# Patient Record
Sex: Male | Born: 1975
Health system: Southern US, Community
[De-identification: ages and names within clinical notes are randomized; demographics above are authoritative.]

## PROBLEM LIST (undated history)

## (undated) DIAGNOSIS — S71131A Puncture wound without foreign body, right thigh, initial encounter: Secondary | ICD-10-CM

## (undated) DIAGNOSIS — E7800 Pure hypercholesterolemia, unspecified: Secondary | ICD-10-CM

## (undated) DIAGNOSIS — F1910 Other psychoactive substance abuse, uncomplicated: Secondary | ICD-10-CM

## (undated) DIAGNOSIS — I10 Essential (primary) hypertension: Secondary | ICD-10-CM

## (undated) DIAGNOSIS — R51 Headache: Secondary | ICD-10-CM

## (undated) DIAGNOSIS — F419 Anxiety disorder, unspecified: Secondary | ICD-10-CM

## (undated) DIAGNOSIS — F1911 Other psychoactive substance abuse, in remission: Secondary | ICD-10-CM

## (undated) DIAGNOSIS — W3400XA Accidental discharge from unspecified firearms or gun, initial encounter: Secondary | ICD-10-CM

## (undated) HISTORY — DX: Accidental discharge from unspecified firearms or gun, initial encounter: W34.00XA

## (undated) HISTORY — DX: Anxiety disorder, unspecified: F41.9

## (undated) HISTORY — DX: Other psychoactive substance abuse, in remission: F19.11

## (undated) HISTORY — DX: Headache: R51

## (undated) HISTORY — DX: Other psychoactive substance abuse, uncomplicated: F19.10

## (undated) HISTORY — PX: WISDOM TOOTH EXTRACTION: SHX21

## (undated) HISTORY — DX: Puncture wound without foreign body, right thigh, initial encounter: S71.131A

---
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
## 2016-10-03 ENCOUNTER — Emergency Department (HOSPITAL_COMMUNITY)
Admission: EM | Admit: 2016-10-03 | Discharge: 2016-10-03 | Disposition: A | Discharge status: Home / Self Care | Payer: No Typology Code available for payment source | Attending: Emergency Medicine | Admitting: Emergency Medicine

## 2016-10-03 ENCOUNTER — Emergency Department (HOSPITAL_COMMUNITY): Payer: No Typology Code available for payment source

## 2016-10-03 DIAGNOSIS — R079 Chest pain, unspecified: Secondary | ICD-10-CM | POA: Diagnosis not present

## 2016-10-03 LAB — POCT I-STAT TROPONIN I
TROPONIN I, POC: 0 ng/mL (ref 0.00–0.08)
Troponin i, poc: 0.01 ng/mL (ref 0.00–0.08)

## 2016-10-03 LAB — CBC
HEMATOCRIT: 41.9 % (ref 39.0–52.0)
HEMOGLOBIN: 14.4 g/dL (ref 13.0–17.0)
MCH: 31.2 pg (ref 26.0–34.0)
MCHC: 34.4 g/dL (ref 30.0–36.0)
MCV: 90.9 fL (ref 78.0–100.0)
Platelets: 271 10*3/uL (ref 150–400)
RBC: 4.61 MIL/uL (ref 4.22–5.81)
RDW: 11.9 % (ref 11.5–15.5)
WBC: 5.3 10*3/uL (ref 4.0–10.5)

## 2016-10-03 LAB — BASIC METABOLIC PANEL
ANION GAP: 8 (ref 5–15)
BUN: 13 mg/dL (ref 6–20)
CHLORIDE: 100 mmol/L — AB (ref 101–111)
CO2: 28 mmol/L (ref 22–32)
Calcium: 9.8 mg/dL (ref 8.9–10.3)
Creatinine, Ser: 1.12 mg/dL (ref 0.61–1.24)
Glucose, Bld: 97 mg/dL (ref 65–99)
POTASSIUM: 4.7 mmol/L (ref 3.5–5.1)
SODIUM: 136 mmol/L (ref 135–145)

## 2016-10-03 NOTE — ED Provider Notes (Signed)
[facility]-EMERGENCY DEPT Provider Note   CSN: [id number] Arrival date & time: 10/03/16  1141     History   Chief Complaint Chief Complaint  Patient presents with  . Chest Pain    HPI Collin Kaiser is a 41 y.o. male.  Intermittent left-sided chest pain with radiation to the left shoulder yesterday for several episodes. No diaphoresis, nausea; slight dyspnea. Nonsmoker. No family history of cardiac disease. He is normally healthy. Symptoms were not related to any activity. He is an Retail buyer my trade. Symptoms have improved today.   He may have hypercholesterolemia.      No past medical history on file.  There are no active problems to display for this patient.   No past surgical history on file.     Home Medications    Prior to Admission medications   Not on File    Family History No family history on file.  Social History Social History  Substance Use Topics  . Smoking status: Not on file  . Smokeless tobacco: Not on file  . Alcohol use Not on file     Allergies   Shellfish allergy   Review of Systems Review of Systems  All other systems reviewed and are negative.    Physical Exam Updated Vital Signs BP (!) 135/93 (BP Location: Left Arm)   Pulse 74   Temp 98.3 F (36.8 C) (Oral)   Resp 18   SpO2 97%   Physical Exam  Constitutional: He is oriented to person, place, and time. He appears well-developed and well-nourished.  HENT:  Head: Normocephalic and atraumatic.  Eyes: Conjunctivae are normal.  Neck: Neck supple.  Cardiovascular: Normal rate and regular rhythm.   Pulmonary/Chest: Effort normal and breath sounds normal.  Abdominal: Soft. Bowel sounds are normal.  Musculoskeletal: Normal range of motion.  Neurological: He is alert and oriented to person, place, and time.  Skin: Skin is warm and dry.  Psychiatric: He has a normal mood and affect. His behavior is normal.  Nursing note and vitals reviewed.    ED Treatments / Results    Labs (all labs ordered are listed, but only abnormal results are displayed) Labs Reviewed  BASIC METABOLIC PANEL - Abnormal; Notable for the following:       Result Value   Chloride 100 (*)    All other components within normal limits  CBC  I-STAT TROPONIN, ED  POCT I-STAT TROPONIN I  I-STAT TROPONIN, ED  POCT I-STAT TROPONIN I    EKG  EKG Interpretation  Date/Time:  Wednesday October 03 2016 12:09:10 EDT Ventricular Rate:  78 PR Interval:    QRS Duration: 70 QT Interval:  376 QTC Calculation: 429 R Axis:   60 Text Interpretation:  Sinus rhythm Minimal ST depression, inferior leads ST elevation, consider anterolateral injury Confirmed by Donnetta Hutching ([id number]) on 10/03/2016 9:00:52 PM       Radiology Dg Chest 2 View  Result Date: 10/03/2016 CLINICAL DATA:  Chest pain on and off for 2 weeks. EXAM: CHEST  2 VIEW COMPARISON:  None. FINDINGS: The cardiac silhouette, mediastinal and hilar contours are within normal limits. There is mild peribronchial thickening and slight increased interstitial markings which could suggest bronchitis. Smoking changes are also possible. No infiltrates, edema or effusions. Bilateral nipple shadows are noted. The bony thorax is intact. IMPRESSION: Bronchitic changes could suggest bronchitis or smoking changes. No infiltrates or effusions. Electronically Signed   By: Rudie Meyer M.D.   On: 10/03/2016 13:15  Procedures Procedures (including critical care time)  Medications Ordered in ED Medications - No data to display   Initial Impression / Assessment and Plan / ED Course  I have reviewed the triage vital signs and the nursing notes.  Pertinent labs & imaging results that were available during my care of the patient were reviewed by me and considered in my medical decision making (see chart for details).     His cardiac risk factor profile is low. Discussed history with cardiologist on call. He will follow-up as an outpatient.  Final  Clinical Impressions(s) / ED Diagnoses   Final diagnoses:  Chest pain, unspecified type    New Prescriptions New Prescriptions   No medications on file     Donnetta Hutching, MD 10/03/16 2307

## 2016-10-03 NOTE — Discharge Instructions (Signed)
I discussed your case with the cardiologist on-call. Please call the office tomorrow for a follow-up appointment. If symptoms worsen and you become concerned, recommend going to [facility] where our cardiology center is located.

## 2016-10-03 NOTE — ED Triage Notes (Signed)
Chest pain x1 week with progressive worsening of sx today. Radiation to bilateral upper extremities. Denies SOB or dizziness.

## 2017-02-15 ENCOUNTER — Encounter (HOSPITAL_COMMUNITY): Payer: Self-pay | Admitting: *Deleted

## 2017-02-15 ENCOUNTER — Other Ambulatory Visit: Payer: Self-pay

## 2017-02-15 ENCOUNTER — Emergency Department (HOSPITAL_COMMUNITY)
Admission: EM | Admit: 2017-02-15 | Discharge: 2017-02-15 | Disposition: A | Discharge status: Home / Self Care | Payer: No Typology Code available for payment source | Attending: Emergency Medicine | Admitting: Emergency Medicine

## 2017-02-15 DIAGNOSIS — R220 Localized swelling, mass and lump, head: Secondary | ICD-10-CM | POA: Diagnosis not present

## 2017-02-15 DIAGNOSIS — T50995A Adverse effect of other drugs, medicaments and biological substances, initial encounter: Secondary | ICD-10-CM | POA: Diagnosis not present

## 2017-02-15 DIAGNOSIS — T7840XA Allergy, unspecified, initial encounter: Secondary | ICD-10-CM

## 2017-02-15 HISTORY — DX: Pure hypercholesterolemia, unspecified: E78.00

## 2017-02-15 LAB — RAPID STREP SCREEN (MED CTR MEBANE ONLY): STREPTOCOCCUS, GROUP A SCREEN (DIRECT): NEGATIVE

## 2017-02-15 MED ORDER — METHYLPREDNISOLONE SODIUM SUCC 125 MG IJ SOLR
125.0000 mg | Freq: Once | INTRAMUSCULAR | Status: AC
  Administered 2017-02-15: 125 mg via INTRAVENOUS
  Filled 2017-02-15: qty 2

## 2017-02-15 MED ORDER — FAMOTIDINE IN NACL 20-0.9 MG/50ML-% IV SOLN
20.0000 mg | Freq: Once | INTRAVENOUS | Status: AC
  Administered 2017-02-15: 20 mg via INTRAVENOUS
  Filled 2017-02-15: qty 50

## 2017-02-15 MED ORDER — PREDNISONE 10 MG PO TABS: 40.0000 mg | ORAL_TABLET | Freq: Every day | ORAL | 0 refills | Status: AC

## 2017-02-15 MED ORDER — DIPHENHYDRAMINE HCL 50 MG/ML IJ SOLN
12.5000 mg | Freq: Once | INTRAMUSCULAR | Status: AC
  Administered 2017-02-15: 12.5 mg via INTRAVENOUS
  Filled 2017-02-15: qty 1

## 2017-02-15 NOTE — ED Notes (Signed)
Pt voices understanding of discharge instructions. NAD. Ambulatory  

## 2017-02-15 NOTE — Discharge Instructions (Signed)
Take steroids as directed starting tomorrow. Continue home medications (except Tamiflu) as needed for respiratory symptoms. Follow-up at [facility] for further evaluation. Return to ED for worsening symptoms, trouble breathing, trouble swallowing, chest or throat tightness.

## 2017-02-15 NOTE — ED Triage Notes (Signed)
Pt reports recent flu symptoms, was started on new medicine yesterday. Last night started having swelling to his mouth, tongue and throat. Took benadryl last night with  No relief. Airway intact at triage.

## 2017-02-15 NOTE — ED Notes (Signed)
ED Provider at bedside. 

## 2017-02-15 NOTE — ED Notes (Signed)
Reports taking tamilfu for the flu. He has only taken one pill when his throat felt scratchy and like it would close up. Pt speaking clear full sentences. Nad

## 2017-02-15 NOTE — ED Provider Notes (Signed)
[facility]   CSN: [id number] Arrival date & time: 02/15/17  0818     History   Chief Complaint Chief Complaint  Patient presents with  . Allergic Reaction    HPI Collin Kaiser is a 42 y.o. male who presents to ED for evaluation of possible allergic reaction after taking 1 dose of Tamiflu last night.  He states that he was prescribed this by a "doctor on demand" and he picked it up from the pharmacy.  He took 1 dose of it last night and started experiencing lip, tongue and throat swelling.  He took Benadryl last night with only mild improvement in his symptoms.  He does have an EpiPen from his shellfish allergy but he did not have to use it last night.  He is here because he states that he still feels like the back of his tongue and throat are swollen.  He has been using other over-the-counter medications to help with his influenza-like symptoms for the past 3 days.  He denies any previous history of similar symptoms in the past.  He denies any trouble breathing, trouble swallowing, fevers, chest pain.  HPI  Past Medical History:  Diagnosis Date  . High cholesterol     There are no active problems to display for this patient.   History reviewed. No pertinent surgical history.     Home Medications    Prior to Admission medications   Medication Sig Start Date End Date Taking? Authorizing Provider  Camphor-Eucalyptus-Menthol (VAPORIZING CHEST RUB EX) Apply 1 application topically daily as needed (congestion/cough).   Yes [provider]  diphenhydrAMINE (BENADRYL) 25 MG tablet Take 25 mg by mouth every 6 (six) hours as needed for allergies.   Yes [provider]  DM-Doxylamine-Acetaminophen (NYQUIL HBP COLD & FLU) 15-6.25-325 MG/15ML LIQD Take 30 mLs by mouth at bedtime as needed (cold/flu sx).   Yes [provider]  EPINEPHrine 0.3 mg/0.3 mL IJ SOAJ injection Inject 0.3 mg into the muscle once as  needed. Allergic reaction 04/03/16  Yes [provider]  ibuprofen (ADVIL,MOTRIN) 200 MG tablet Take 200 mg by mouth every 6 (six) hours as needed for headache.   Yes [provider]  oseltamivir (TAMIFLU) 75 MG capsule Take 75 mg by mouth 2 (two) times daily.    [provider]  predniSONE (DELTASONE) 10 MG tablet Take 4 tablets (40 mg total) by mouth daily for 4 days. 02/15/17 02/19/17  Collin Kaiser, Collin Wyeth, PA-C    Family History History reviewed. No pertinent family history.  Social History Social History   Tobacco Use  . Smoking status: Never Smoker  Substance Use Topics  . Alcohol use: Not on file  . Drug use: Not on file     Allergies   Shellfish allergy and Tamiflu [oseltamivir phosphate]   Review of Systems Review of Systems  Constitutional: Negative for appetite change, chills and fever.  HENT: Positive for facial swelling. Negative for ear pain, rhinorrhea, sneezing and sore throat.   Eyes: Negative for photophobia and visual disturbance.  Respiratory: Negative for cough, chest tightness, shortness of breath and wheezing.   Cardiovascular: Negative for chest pain and palpitations.  Gastrointestinal: Negative for abdominal pain, blood in stool, constipation, diarrhea, nausea and vomiting.  Genitourinary: Negative for dysuria, hematuria and urgency.  Musculoskeletal: Negative for myalgias.  Skin: Negative for rash.  Neurological: Negative for dizziness, weakness and light-headedness.     Physical Exam Updated Vital Signs BP 123/81 (BP Location:  Left Arm)   Pulse 79   Temp 99 F (37.2 C) (Oral)   Resp 16   SpO2 98%   Physical Exam  Constitutional: He appears well-developed and well-nourished. No distress.  Nontoxic appearing and in no acute distress.  Speaking complete sentences without difficulty.  Airway appears intact.  HENT:  Head: Normocephalic and atraumatic.  Nose: Nose normal.  Mouth/Throat: Uvula is midline. No oral lesions. No  trismus in the jaw. No dental abscesses or uvula swelling. Tonsils are 1+ on the right. Tonsils are 1+ on the left.  No tongue swelling or lip swelling noted on my examination.  Patient is tolerating secretions without difficulty.  Eyes: Conjunctivae and EOM are normal. Right eye exhibits no discharge. Left eye exhibits no discharge. No scleral icterus.  Neck: Normal range of motion. Neck supple.  Cardiovascular: Normal rate, regular rhythm, normal heart sounds and intact distal pulses. Exam reveals no gallop and no friction rub.  No murmur heard. Pulmonary/Chest: Effort normal and breath sounds normal. No stridor. No respiratory distress.  Abdominal: Soft. Bowel sounds are normal. He exhibits no distension. There is no tenderness. There is no guarding.  Musculoskeletal: Normal range of motion. He exhibits no edema.  Neurological: He is alert. He exhibits normal muscle tone. Coordination normal.  Skin: Skin is warm and dry. No rash noted.  Psychiatric: He has a normal mood and affect.  Nursing note and vitals reviewed.    ED Treatments / Results  Labs (all labs ordered are listed, but only abnormal results are displayed) Labs Reviewed  RAPID STREP SCREEN (NOT AT [facility])  CULTURE, GROUP A STREP [facility])    EKG  EKG Interpretation None       Radiology No results found.  Procedures Procedures (including critical care time)  Medications Ordered in ED Medications  methylPREDNISolone sodium succinate (SOLU-MEDROL) 125 mg/2 mL injection 125 mg (125 mg Intravenous Given 02/15/17 0922)  diphenhydrAMINE (BENADRYL) injection 12.5 mg (12.5 mg Intravenous Given 02/15/17 0921)  famotidine (PEPCID) IVPB 20 mg premix (0 mg Intravenous Stopped 02/15/17 1008)     Initial Impression / Assessment and Plan / ED Course  I have reviewed the triage vital signs and the nursing notes.  Pertinent labs & imaging results that were available during my care of the patient were reviewed by me and considered in  my medical decision making (see chart for details).  Clinical Course as of Feb 15 1041  Fri Feb 15, 2017  [id number] 42 year old male with prior history of allergic reaction to shellfish who recently started with possible flu sore throat cough myalgias and was given Tamiflu.  This morning he felt like his throat was closing was concerned he might be allergic to Tamiflu.  Here on exam is got no stridor is in no distress with a patent airway and minimal pharyngeal erythema.  He is getting medications for possible allergic reaction although this potentially is just his illness.  [MB]    Clinical Course User Index [MB] Terrilee Files, MD    Patient presents to ED for evaluation of possible allergic reaction to Tamiflu.  He states that he took 1 dose last night for the first time and began experiencing sensation of throat tightness and throat irritation.  He he tells me that the symptoms have not resolved and he is continuing to have feelings of throat tightness and swelling to the back of his tongue.  However, physical examination patient does not appear to have any type of allergic reaction or  anaphylaxis.  He has a patent airway and is satting at 98-100% on room air without difficulty.  There is no stridor or trismus noted.  However, due to patient's concern and possible symptoms yesterday, he was given allergic cocktail with complete resolution of his symptoms. This could be a reaction to the medication but could also be a symptom of his recent viral URI.  Strep test was negative.  I did tell him that he should discontinue the Tamiflu and continue symptomatic treatment as needed. Patient appears stable for discharge at this time.  Strict return precautions given.  Portions of this note were generated with Scientist, clinical (histocompatibility and immunogenetics). Dictation errors may occur despite best attempts at proofreading.  Final Clinical Impressions(s) / ED Diagnoses   Final diagnoses:  Allergic reaction to drug, initial  encounter    ED Discharge Orders        Ordered    predniSONE (DELTASONE) 10 MG tablet  Daily     02/15/17 1042       Collin Pates, PA-C 02/15/17 1044    Terrilee Files, MD 02/16/17 [phone]

## 2017-02-17 LAB — CULTURE, GROUP A STREP (THRC)

## 2017-06-20 ENCOUNTER — Ambulatory Visit: Payer: No Typology Code available for payment source | Admitting: Family Medicine

## 2017-06-20 DIAGNOSIS — Z0289 Encounter for other administrative examinations: Secondary | ICD-10-CM

## 2017-06-21 ENCOUNTER — Encounter: Payer: Self-pay | Admitting: Family Medicine

## 2017-06-21 DIAGNOSIS — K921 Melena: Secondary | ICD-10-CM | POA: Insufficient documentation

## 2017-06-21 DIAGNOSIS — R519 Headache, unspecified: Secondary | ICD-10-CM

## 2017-06-21 DIAGNOSIS — R51 Headache: Secondary | ICD-10-CM

## 2017-06-21 DIAGNOSIS — F1911 Other psychoactive substance abuse, in remission: Secondary | ICD-10-CM

## 2017-06-21 HISTORY — DX: Other psychoactive substance abuse, in remission: F19.11

## 2017-06-21 HISTORY — DX: Headache, unspecified: R51.9

## 2017-06-24 ENCOUNTER — Encounter: Payer: Self-pay | Admitting: Family Medicine

## 2017-06-24 ENCOUNTER — Ambulatory Visit: Payer: No Typology Code available for payment source | Admitting: Family Medicine

## 2017-06-24 VITALS — BP 122/80 | HR 68 | Temp 99.0°F | Ht 67.0 in | Wt 203.5 lb

## 2017-06-24 DIAGNOSIS — E785 Hyperlipidemia, unspecified: Secondary | ICD-10-CM | POA: Insufficient documentation

## 2017-06-24 DIAGNOSIS — Z114 Encounter for screening for human immunodeficiency virus [HIV]: Secondary | ICD-10-CM

## 2017-06-24 DIAGNOSIS — Z113 Encounter for screening for infections with a predominantly sexual mode of transmission: Secondary | ICD-10-CM | POA: Diagnosis not present

## 2017-06-24 DIAGNOSIS — E782 Mixed hyperlipidemia: Secondary | ICD-10-CM | POA: Diagnosis not present

## 2017-06-24 DIAGNOSIS — G44209 Tension-type headache, unspecified, not intractable: Secondary | ICD-10-CM | POA: Diagnosis not present

## 2017-06-24 DIAGNOSIS — Z23 Encounter for immunization: Secondary | ICD-10-CM | POA: Diagnosis not present

## 2017-06-24 DIAGNOSIS — R5383 Other fatigue: Secondary | ICD-10-CM | POA: Diagnosis not present
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
## 2017-06-25 ENCOUNTER — Other Ambulatory Visit (HOSPITAL_COMMUNITY)
Admission: RE | Admit: 2017-06-25 | Discharge: 2017-06-25 | Disposition: A | Discharge status: Home / Self Care | Payer: No Typology Code available for payment source | Source: Ambulatory Visit | Attending: Family Medicine | Admitting: Family Medicine

## 2017-06-25 ENCOUNTER — Other Ambulatory Visit (INDEPENDENT_AMBULATORY_CARE_PROVIDER_SITE_OTHER): Payer: No Typology Code available for payment source

## 2017-06-25 DIAGNOSIS — R5383 Other fatigue: Secondary | ICD-10-CM | POA: Diagnosis not present

## 2017-06-25 DIAGNOSIS — Z114 Encounter for screening for human immunodeficiency virus [HIV]: Secondary | ICD-10-CM | POA: Diagnosis not present

## 2017-06-25 DIAGNOSIS — Z113 Encounter for screening for infections with a predominantly sexual mode of transmission: Secondary | ICD-10-CM | POA: Insufficient documentation

## 2017-06-25 DIAGNOSIS — E782 Mixed hyperlipidemia: Secondary | ICD-10-CM | POA: Diagnosis not present

## 2017-06-25 LAB — VITAMIN D 25 HYDROXY (VIT D DEFICIENCY, FRACTURES): VITD: 11.88 ng/mL — ABNORMAL LOW (ref 30.00–100.00)

## 2017-06-25 LAB — CBC WITH DIFFERENTIAL/PLATELET
Basophils Absolute: 0 10*3/uL (ref 0.0–0.1)
Basophils Relative: 0.9 % (ref 0.0–3.0)
EOS ABS: 0.2 10*3/uL (ref 0.0–0.7)
Eosinophils Relative: 3.9 % (ref 0.0–5.0)
HCT: 43.3 % (ref 39.0–52.0)
HEMOGLOBIN: 14.4 g/dL (ref 13.0–17.0)
Lymphocytes Relative: 36 % (ref 12.0–46.0)
Lymphs Abs: 1.7 10*3/uL (ref 0.7–4.0)
MCHC: 33.2 g/dL (ref 30.0–36.0)
MCV: 95.4 fl (ref 78.0–100.0)
MONO ABS: 0.5 10*3/uL (ref 0.1–1.0)
Monocytes Relative: 10.7 % (ref 3.0–12.0)
Neutro Abs: 2.3 10*3/uL (ref 1.4–7.7)
Neutrophils Relative %: 48.5 % (ref 43.0–77.0)
Platelets: 298 10*3/uL (ref 150.0–400.0)
RBC: 4.54 Mil/uL (ref 4.22–5.81)
RDW: 12.3 % (ref 11.5–15.5)
WBC: 4.8 10*3/uL (ref 4.0–10.5)

## 2017-06-25 LAB — COMPREHENSIVE METABOLIC PANEL
ALBUMIN: 4.6 g/dL (ref 3.5–5.2)
ALK PHOS: 74 U/L (ref 39–117)
ALT: 22 U/L (ref 0–53)
AST: 15 U/L (ref 0–37)
BUN: 13 mg/dL (ref 6–23)
CO2: 29 mEq/L (ref 19–32)
CREATININE: 0.99 mg/dL (ref 0.40–1.50)
Calcium: 9.9 mg/dL (ref 8.4–10.5)
Chloride: 101 mEq/L (ref 96–112)
GFR: 106.9 mL/min (ref >60.00)
Glucose, Bld: 100 mg/dL — ABNORMAL HIGH (ref 70–99)
Potassium: 4.5 mEq/L (ref 3.5–5.1)
SODIUM: 137 meq/L (ref 135–145)
TOTAL PROTEIN: 7.7 g/dL (ref 6.0–8.3)
Total Bilirubin: 0.8 mg/dL (ref 0.2–1.2)

## 2017-06-25 LAB — LIPID PANEL
Cholesterol: 249 mg/dL — ABNORMAL HIGH (ref 0–200)
HDL: 63.7 mg/dL (ref >39.00)
NonHDL: 185.74
Total CHOL/HDL Ratio: 4
Triglycerides: 250 mg/dL — ABNORMAL HIGH (ref 0.0–149.0)
VLDL: 50 mg/dL — ABNORMAL HIGH (ref 0.0–40.0)

## 2017-06-25 LAB — LDL CHOLESTEROL, DIRECT: LDL DIRECT: 95 mg/dL

## 2017-06-25 LAB — TESTOSTERONE: TESTOSTERONE: 419.7 ng/dL (ref 300.00–890.00)

## 2017-06-25 LAB — TSH: TSH: 0.45 u[IU]/mL (ref 0.35–4.50)

## 2017-06-26 ENCOUNTER — Other Ambulatory Visit: Payer: Self-pay | Admitting: Family Medicine

## 2017-06-26 DIAGNOSIS — E782 Mixed hyperlipidemia: Secondary | ICD-10-CM

## 2017-06-26 DIAGNOSIS — E559 Vitamin D deficiency, unspecified: Secondary | ICD-10-CM | POA: Insufficient documentation

## 2017-06-26 DIAGNOSIS — R7309 Other abnormal glucose: Secondary | ICD-10-CM

## 2017-06-26 LAB — HIV ANTIBODY (ROUTINE TESTING W REFLEX): HIV: NONREACTIVE

## 2017-06-26 MED ORDER — VITAMIN D (ERGOCALCIFEROL) 1.25 MG (50000 UNIT) PO CAPS: ORAL_CAPSULE | ORAL | 0 refills | Status: DC

## 2017-06-27 LAB — URINE CYTOLOGY ANCILLARY ONLY
Chlamydia: NEGATIVE
Neisseria Gonorrhea: NEGATIVE

## 2017-09-15 ENCOUNTER — Other Ambulatory Visit: Payer: Self-pay | Admitting: Family Medicine

## 2017-10-25 ENCOUNTER — Emergency Department (HOSPITAL_COMMUNITY)
Admission: EM | Admit: 2017-10-25 | Discharge: 2017-10-25 | Disposition: A | Discharge status: Home / Self Care | Payer: No Typology Code available for payment source | Attending: Emergency Medicine | Admitting: Emergency Medicine

## 2017-10-25 ENCOUNTER — Emergency Department (HOSPITAL_COMMUNITY): Payer: No Typology Code available for payment source

## 2017-10-25 ENCOUNTER — Other Ambulatory Visit: Payer: Self-pay

## 2017-10-25 ENCOUNTER — Encounter (HOSPITAL_COMMUNITY): Payer: Self-pay

## 2017-10-25 DIAGNOSIS — R0789 Other chest pain: Secondary | ICD-10-CM | POA: Diagnosis not present

## 2017-10-25 DIAGNOSIS — R42 Dizziness and giddiness: Secondary | ICD-10-CM | POA: Insufficient documentation

## 2017-10-25 DIAGNOSIS — R51 Headache: Secondary | ICD-10-CM | POA: Diagnosis not present

## 2017-10-25 LAB — CBC
HEMATOCRIT: 44.7 % (ref 39.0–52.0)
HEMOGLOBIN: 14.6 g/dL (ref 13.0–17.0)
MCH: 30.9 pg (ref 26.0–34.0)
MCHC: 32.7 g/dL (ref 30.0–36.0)
MCV: 94.7 fL (ref 80.0–100.0)
NRBC: 0 % (ref 0.0–0.2)
Platelets: 317 10*3/uL (ref 150–400)
RBC: 4.72 MIL/uL (ref 4.22–5.81)
RDW: 11 % — ABNORMAL LOW (ref 11.5–15.5)
WBC: 6.1 10*3/uL (ref 4.0–10.5)

## 2017-10-25 LAB — I-STAT TROPONIN, ED
TROPONIN I, POC: 0.01 ng/mL (ref 0.00–0.08)
Troponin i, poc: 0 ng/mL (ref 0.00–0.08)

## 2017-10-25 LAB — BASIC METABOLIC PANEL
Anion gap: 7 (ref 5–15)
BUN: 19 mg/dL (ref 6–20)
CHLORIDE: 104 mmol/L (ref 98–111)
CO2: 26 mmol/L (ref 22–32)
Calcium: 9.5 mg/dL (ref 8.9–10.3)
Creatinine, Ser: 1.08 mg/dL (ref 0.61–1.24)
GFR calc non Af Amer: >60 mL/min (ref >60)
Glucose, Bld: 131 mg/dL — ABNORMAL HIGH (ref 70–99)
POTASSIUM: 4.5 mmol/L (ref 3.5–5.1)
SODIUM: 137 mmol/L (ref 135–145)

## 2017-10-25 NOTE — ED Notes (Signed)
Patient transported to X-ray 
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
## 2017-10-25 NOTE — ED Notes (Signed)
Patient verbalizes understanding of discharge instructions. Opportunity for questioning and answers were provided. Ambulatory at discharge in NAD.  

## 2017-10-25 NOTE — ED Notes (Signed)
Pt returned from xray

## 2017-10-25 NOTE — ED Triage Notes (Addendum)
Pt presents for evaluation of bilateral lower leg weakness, reports some headache today as well. Denies pain to legs. Denies other focal neuro deficits. Pt reports some mild CP today. Pt also reports some facial numbness across entire lower part of face bilaterally.

## 2017-10-25 NOTE — ED Provider Notes (Signed)
Patient placed in Quick Look pathway, seen and evaluated   Chief Complaint: Weakness   HPI:   Collin Kaiser is a 42 y.o. male who presents to ED for bilateral lower extremity weakness the last 2 weeks.  Today, while he was at work, he again felt lower extremity weakness as well as mild central chest pain and headache.  Just prior to emergency department arrival, he noticed some perioral tingling.  ROS: + Lower extremity weakness, chest pain  -Fever, shortness of breath  Physical Exam:   Gen: No distress  Neuro: Awake and Alert  Skin: Warm    Focused Exam: RRR, lungs clear   Initiation of care has begun. The patient has been counseled on the process, plan, and necessity for staying for the completion/evaluation, and the remainder of the medical screening examination    Ward, Chase Picket, PA-C 10/25/17 1637    Gwyneth Sprout, MD 10/25/17 2342

## 2017-10-25 NOTE — Discharge Instructions (Signed)
Please read instructions below.  Drink plenty of water and maintain a balanced diet. Caffeine can contribute to anxiety. Return to the ER for new or worsening symptoms; including worsening chest pain, shortness of breath, pain that radiates to the arm or neck, pain or shortness of breath worsened with exertion.  Follow up with your primary care provider.

## 2018-01-31 ENCOUNTER — Encounter: Payer: Self-pay | Admitting: Physician Assistant

## 2018-01-31 ENCOUNTER — Ambulatory Visit: Payer: No Typology Code available for payment source | Admitting: Physician Assistant

## 2018-01-31 VITALS — BP 130/80 | HR 75 | Temp 99.3°F | Ht 67.0 in | Wt 212.2 lb

## 2018-01-31 DIAGNOSIS — R05 Cough: Secondary | ICD-10-CM | POA: Diagnosis not present

## 2018-01-31 DIAGNOSIS — R6889 Other general symptoms and signs: Secondary | ICD-10-CM

## 2018-01-31 DIAGNOSIS — R059 Cough, unspecified: Secondary | ICD-10-CM

## 2018-01-31 LAB — POC INFLUENZA A&B (BINAX/QUICKVUE)
Influenza A, POC: NEGATIVE
Influenza B, POC: NEGATIVE

## 2018-01-31 MED ORDER — AZITHROMYCIN 250 MG PO TABS: ORAL_TABLET | ORAL | 0 refills | Status: DC

## 2018-01-31 MED ORDER — ALBUTEROL SULFATE HFA 108 (90 BASE) MCG/ACT IN AERS: 2.0000 | INHALATION_SPRAY | Freq: Four times a day (QID) | RESPIRATORY_TRACT | 2 refills | Status: DC | PRN

## 2018-01-31 MED ORDER — BENZONATATE 100 MG PO CAPS: 100.0000 mg | ORAL_CAPSULE | Freq: Three times a day (TID) | ORAL | 1 refills | Status: DC | PRN
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
## 2018-01-31 NOTE — Patient Instructions (Signed)
It was great to see you!  Your flu test is negative.  1. Start oral azithromycin (antibiotic) 2. Start tessalon perles, take as needed for cough 3. Take delsym (dextromethorphan) OR if your mucinex product has this in it, just be sure to continue to take this 4. Use albuterol as needed for coughing fits, may take up to every 4-6 hours for severe coughing fits  Push fluids and get plenty of rest. Please return if you are not improving as expected, or if you have high fevers (>101.5) or difficulty swallowing or worsening productive cough.  Call clinic with questions.  I hope you start feeling better soon!

## 2018-03-18 ENCOUNTER — Ambulatory Visit: Payer: No Typology Code available for payment source | Admitting: Family Medicine

## 2018-03-18 ENCOUNTER — Encounter: Payer: Self-pay | Admitting: Family Medicine

## 2018-03-18 VITALS — BP 138/76 | HR 74 | Temp 98.3°F | Ht 67.0 in | Wt 210.0 lb

## 2018-03-18 DIAGNOSIS — R7309 Other abnormal glucose: Secondary | ICD-10-CM

## 2018-03-18 DIAGNOSIS — F411 Generalized anxiety disorder: Secondary | ICD-10-CM

## 2018-03-18 LAB — TSH: TSH: 0.83 u[IU]/mL (ref 0.35–4.50)

## 2018-03-18 LAB — HEMOGLOBIN A1C: Hgb A1c MFr Bld: 4.7 % (ref 4.6–6.5)

## 2018-03-18 MED ORDER — FLUOXETINE HCL 20 MG PO TABS: 20.0000 mg | ORAL_TABLET | Freq: Every day | ORAL | 3 refills | Status: DC

## 2018-03-18 MED ORDER — HYDROXYZINE HCL 25 MG PO TABS: 25.0000 mg | ORAL_TABLET | Freq: Three times a day (TID) | ORAL | 1 refills | Status: DC | PRN

## 2018-03-18 NOTE — Patient Instructions (Signed)
Starting you on prozac 20mg  daily. Can take 3-4 weeks to get into your system so don't expect a miracle overnight.  hydroxyzine prn for panic attacks or increased feeling of anxiety. May need to take 1/2 tab as it makes you drowsy.   Generalized Anxiety Disorder, Adult Generalized anxiety disorder (GAD) is a mental health disorder. People with this condition constantly worry about everyday events. Unlike normal anxiety, worry related to GAD is not triggered by a specific event. These worries also do not fade or get better with time. GAD interferes with life functions, including relationships, work, and school. GAD can vary from mild to severe. People with severe GAD can have intense waves of anxiety with physical symptoms (panic attacks). What are the causes? The exact cause of GAD is not known. What increases the risk? This condition is more likely to develop in:  Women.  People who have a family history of anxiety disorders.  People who are very shy.  People who experience very stressful life events, such as the death of a loved one.  People who have a very stressful family environment. What are the signs or symptoms? People with GAD often worry excessively about many things in their lives, such as their health and family. They may also be overly concerned about:  Doing well at work.  Being on time.  Natural disasters.  Friendships. Physical symptoms of GAD include:  Fatigue.  Muscle tension or having muscle twitches.  Trembling or feeling shaky.  Being easily startled.  Feeling like your heart is pounding or racing.  Feeling out of breath or like you cannot take a deep breath.  Having trouble falling asleep or staying asleep.  Sweating.  Nausea, diarrhea, or irritable bowel syndrome (IBS).  Headaches.  Trouble concentrating or remembering facts.  Restlessness.  Irritability. How is this diagnosed? Your health care provider can diagnose GAD based on your  symptoms and medical history. You will also have a physical exam. The health care provider will ask specific questions about your symptoms, including how severe they are, when they started, and if they come and go. Your health care provider may ask you about your use of alcohol or drugs, including prescription medicines. Your health care provider may refer you to a mental health specialist for further evaluation. Your health care provider will do a thorough examination and may perform additional tests to rule out other possible causes of your symptoms. To be diagnosed with GAD, a person must have anxiety that:  Is out of his or her control.  Affects several different aspects of his or her life, such as work and relationships.  Causes distress that makes him or her unable to take part in normal activities.  Includes at least three physical symptoms of GAD, such as restlessness, fatigue, trouble concentrating, irritability, muscle tension, or sleep problems. Before your health care provider can confirm a diagnosis of GAD, these symptoms must be present more days than they are not, and they must last for six months or longer. How is this treated? The following therapies are usually used to treat GAD:  Medicine. Antidepressant medicine is usually prescribed for long-term daily control. Antianxiety medicines may be added in severe cases, especially when panic attacks occur.  Talk therapy (psychotherapy). Certain types of talk therapy can be helpful in treating GAD by providing support, education, and guidance. Options include: ? Cognitive behavioral therapy (CBT). People learn coping skills and techniques to ease their anxiety. They learn to identify unrealistic or negative  thoughts and behaviors and to replace them with positive ones. ? Acceptance and commitment therapy (ACT). This treatment teaches people how to be mindful as a way to cope with unwanted thoughts and feelings. ? Biofeedback. This  process trains you to manage your body's response (physiological response) through breathing techniques and relaxation methods. You will work with a therapist while machines are used to monitor your physical symptoms.  Stress management techniques. These include yoga, meditation, and exercise. A mental health specialist can help determine which treatment is best for you. Some people see improvement with one type of therapy. However, other people require a combination of therapies. Follow these instructions at home:  Take over-the-counter and prescription medicines only as told by your health care provider.  Try to maintain a normal routine.  Try to anticipate stressful situations and allow extra time to manage them.  Practice any stress management or self-calming techniques as taught by your health care provider.  Do not punish yourself for setbacks or for not making progress.  Try to recognize your accomplishments, even if they are small.  Keep all follow-up visits as told by your health care provider. This is important. Contact a health care provider if:  Your symptoms do not get better.  Your symptoms get worse.  You have signs of depression, such as: ? A persistently sad, cranky, or irritable mood. ? Loss of enjoyment in activities that used to bring you joy. ? Change in weight or eating. ? Changes in sleeping habits. ? Avoiding friends or family members. ? Loss of energy for normal tasks. ? Feelings of guilt or worthlessness. Get help right away if:  You have serious thoughts about hurting yourself or others. If you ever feel like you may hurt yourself or others, or have thoughts about taking your own life, get help right away. You can go to your nearest emergency department or call:  Your local emergency services (911 in the [place]  A suicide crisis helpline, such as the National Suicide Prevention Lifeline at 506-704-9573. This is open 24 hours a  day. Summary  Generalized anxiety disorder (GAD) is a mental health disorder that involves worry that is not triggered by a specific event.  People with GAD often worry excessively about many things in their lives, such as their health and family.  GAD may cause physical symptoms such as restlessness, trouble concentrating, sleep problems, frequent sweating, nausea, diarrhea, headaches, and trembling or muscle twitching.  A mental health specialist can help determine which treatment is best for you. Some people see improvement with one type of therapy. However, other people require a combination of therapies. This information is not intended to replace advice given to you by your health care provider. Make sure you discuss any questions you have with your health care provider. Document Released: 04/28/2012 Document Revised: 11/22/2015 Document Reviewed: 11/22/2015 Elsevier Interactive Patient Education  2019 [employer].
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
## 2018-04-09 ENCOUNTER — Other Ambulatory Visit: Payer: Self-pay | Admitting: Family Medicine

## 2018-04-28 ENCOUNTER — Encounter: Payer: Self-pay | Admitting: Family Medicine

## 2018-04-28 ENCOUNTER — Other Ambulatory Visit: Payer: Self-pay

## 2018-04-28 ENCOUNTER — Ambulatory Visit (INDEPENDENT_AMBULATORY_CARE_PROVIDER_SITE_OTHER): Payer: No Typology Code available for payment source | Admitting: Family Medicine

## 2018-04-28 VITALS — Ht 67.0 in | Wt 210.0 lb

## 2018-04-28 DIAGNOSIS — F411 Generalized anxiety disorder: Secondary | ICD-10-CM | POA: Diagnosis not present
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
## 2018-07-09 ENCOUNTER — Other Ambulatory Visit: Payer: Self-pay | Admitting: Family Medicine

## 2018-09-10 ENCOUNTER — Encounter: Payer: Self-pay | Admitting: Family Medicine

## 2018-09-15 ENCOUNTER — Encounter: Payer: Self-pay | Admitting: Family Medicine

## 2018-09-15 ENCOUNTER — Ambulatory Visit: Payer: No Typology Code available for payment source | Admitting: Family Medicine

## 2018-09-15 ENCOUNTER — Other Ambulatory Visit: Payer: Self-pay

## 2018-09-15 VITALS — BP 122/82 | HR 72 | Temp 98.3°F | Ht 67.0 in | Wt 209.2 lb

## 2018-09-15 DIAGNOSIS — R61 Generalized hyperhidrosis: Secondary | ICD-10-CM

## 2018-09-15 DIAGNOSIS — F1010 Alcohol abuse, uncomplicated: Secondary | ICD-10-CM | POA: Diagnosis not present

## 2018-09-15 DIAGNOSIS — M79671 Pain in right foot: Secondary | ICD-10-CM

## 2018-09-15 DIAGNOSIS — R202 Paresthesia of skin: Secondary | ICD-10-CM

## 2018-09-15 DIAGNOSIS — M79672 Pain in left foot: Secondary | ICD-10-CM

## 2018-09-15 LAB — CBC WITH DIFFERENTIAL/PLATELET
Basophils Absolute: 0 10*3/uL (ref 0.0–0.1)
Basophils Relative: 0.8 % (ref 0.0–3.0)
Eosinophils Absolute: 0.2 10*3/uL (ref 0.0–0.7)
Eosinophils Relative: 5.6 % — ABNORMAL HIGH (ref 0.0–5.0)
HCT: 42 % (ref 39.0–52.0)
Hemoglobin: 13.9 g/dL (ref 13.0–17.0)
Lymphocytes Relative: 42.2 % (ref 12.0–46.0)
Lymphs Abs: 1.5 10*3/uL (ref 0.7–4.0)
MCHC: 33.2 g/dL (ref 30.0–36.0)
MCV: 94.6 fl (ref 78.0–100.0)
Monocytes Absolute: 0.4 10*3/uL (ref 0.1–1.0)
Monocytes Relative: 11.3 % (ref 3.0–12.0)
Neutro Abs: 1.4 10*3/uL (ref 1.4–7.7)
Neutrophils Relative %: 40.1 % — ABNORMAL LOW (ref 43.0–77.0)
Platelets: 257 10*3/uL (ref 150.0–400.0)
RBC: 4.44 Mil/uL (ref 4.22–5.81)
RDW: 12.1 % (ref 11.5–15.5)
WBC: 3.6 10*3/uL — ABNORMAL LOW (ref 4.0–10.5)

## 2018-09-15 LAB — COMPREHENSIVE METABOLIC PANEL
ALT: 36 U/L (ref 0–53)
AST: 24 U/L (ref 0–37)
Albumin: 4.5 g/dL (ref 3.5–5.2)
Alkaline Phosphatase: 68 U/L (ref 39–117)
BUN: 16 mg/dL (ref 6–23)
CO2: 26 mEq/L (ref 19–32)
Calcium: 9.4 mg/dL (ref 8.4–10.5)
Chloride: 102 mEq/L (ref 96–112)
Creatinine, Ser: 0.97 mg/dL (ref 0.40–1.50)
GFR: 102.37 mL/min (ref >60.00)
Glucose, Bld: 100 mg/dL — ABNORMAL HIGH (ref 70–99)
Potassium: 4.1 mEq/L (ref 3.5–5.1)
Sodium: 136 mEq/L (ref 135–145)
Total Bilirubin: 0.8 mg/dL (ref 0.2–1.2)
Total Protein: 7.4 g/dL (ref 6.0–8.3)

## 2018-09-15 LAB — VITAMIN B12: Vitamin B-12: 608 pg/mL (ref 211–911)

## 2018-09-15 LAB — TSH: TSH: 1.01 u[IU]/mL (ref 0.35–4.50)

## 2018-09-15 NOTE — Patient Instructions (Signed)
Large differential... Will start with labs and go from there.  Keep log of how often you are getting tingling..  -try to get more sleep and stop drinking to see if symptoms get better.
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
## 2018-09-17 LAB — ANA: Anti Nuclear Antibody (ANA): NEGATIVE

## 2018-09-18 ENCOUNTER — Other Ambulatory Visit: Payer: Self-pay | Admitting: Family Medicine

## 2018-09-18 DIAGNOSIS — R202 Paresthesia of skin: Secondary | ICD-10-CM

## 2018-10-07 ENCOUNTER — Encounter: Payer: Self-pay | Admitting: Neurology

## 2018-10-08 ENCOUNTER — Telehealth (INDEPENDENT_AMBULATORY_CARE_PROVIDER_SITE_OTHER): Payer: No Typology Code available for payment source | Admitting: Neurology

## 2018-10-08 ENCOUNTER — Other Ambulatory Visit: Payer: Self-pay

## 2018-10-08 VITALS — Ht 67.0 in | Wt 210.0 lb

## 2018-10-08 DIAGNOSIS — R202 Paresthesia of skin: Secondary | ICD-10-CM | POA: Diagnosis not present

## 2018-10-08 DIAGNOSIS — F1010 Alcohol abuse, uncomplicated: Secondary | ICD-10-CM | POA: Diagnosis not present
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
## 2018-10-16 ENCOUNTER — Telehealth (INDEPENDENT_AMBULATORY_CARE_PROVIDER_SITE_OTHER): Payer: No Typology Code available for payment source | Admitting: Family Medicine

## 2018-10-16 ENCOUNTER — Encounter: Payer: Self-pay | Admitting: Family Medicine

## 2018-10-16 ENCOUNTER — Other Ambulatory Visit: Payer: Self-pay

## 2018-10-16 VITALS — Temp 98.7°F

## 2018-10-16 DIAGNOSIS — J029 Acute pharyngitis, unspecified: Secondary | ICD-10-CM
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
## 2018-10-16 NOTE — Patient Instructions (Signed)
Try taking a Tums or other OTC acid reducer today if you have further symptoms to see if this helps.  Can gargle salt water. Can also use tylenol or aleve per instructions if needed.  Follow up tomorrow evening to see how you are doing and to see if further evaluation and/or work restriction is needed or not.   Seek emergency or urgent care evaluation in the interim if significantly worsening, difficulty swallowing, choking or any severe symptoms.     WORK SLIP:  Patient Collin Kaiser,  12/17/1975, was seen today (10/16/2018) in consultation. Please excuse from work until re-examined tomorrow and determination of any further work restriction is made.   Sincerely: E-signature: Dr. Colin Benton, DO [place] Ph: [phone]

## 2018-10-17 ENCOUNTER — Encounter: Payer: Self-pay | Admitting: Family Medicine

## 2018-10-17 ENCOUNTER — Ambulatory Visit (INDEPENDENT_AMBULATORY_CARE_PROVIDER_SITE_OTHER): Payer: No Typology Code available for payment source | Admitting: Family Medicine

## 2018-10-17 VITALS — Ht 67.0 in | Wt 210.0 lb

## 2018-10-17 DIAGNOSIS — J029 Acute pharyngitis, unspecified: Secondary | ICD-10-CM | POA: Diagnosis not present

## 2018-10-17 DIAGNOSIS — Z20828 Contact with and (suspected) exposure to other viral communicable diseases: Secondary | ICD-10-CM | POA: Diagnosis not present

## 2018-10-17 NOTE — Progress Notes (Signed)
Called and attempted to leave a voicemail to schedule a virtual visit for today for re-evaluation for sore throat. Voicemail was full.
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
## 2018-10-20 ENCOUNTER — Other Ambulatory Visit: Payer: Self-pay

## 2018-10-20 DIAGNOSIS — Z20822 Contact with and (suspected) exposure to covid-19: Secondary | ICD-10-CM

## 2018-10-22 LAB — NOVEL CORONAVIRUS, NAA: SARS-CoV-2, NAA: NOT DETECTED

## 2018-11-18 ENCOUNTER — Ambulatory Visit: Payer: No Typology Code available for payment source | Admitting: Neurology

## 2018-11-18 ENCOUNTER — Other Ambulatory Visit (INDEPENDENT_AMBULATORY_CARE_PROVIDER_SITE_OTHER): Payer: No Typology Code available for payment source

## 2018-11-18 ENCOUNTER — Encounter: Payer: Self-pay | Admitting: Neurology

## 2018-11-18 ENCOUNTER — Ambulatory Visit (INDEPENDENT_AMBULATORY_CARE_PROVIDER_SITE_OTHER): Payer: No Typology Code available for payment source | Admitting: Neurology

## 2018-11-18 ENCOUNTER — Other Ambulatory Visit: Payer: No Typology Code available for payment source

## 2018-11-18 ENCOUNTER — Other Ambulatory Visit: Payer: Self-pay

## 2018-11-18 VITALS — BP 140/90 | HR 97 | Ht 68.0 in | Wt 213.0 lb

## 2018-11-18 DIAGNOSIS — R202 Paresthesia of skin: Secondary | ICD-10-CM | POA: Diagnosis not present

## 2018-11-18 DIAGNOSIS — G5603 Carpal tunnel syndrome, bilateral upper limbs: Secondary | ICD-10-CM

## 2018-11-18 DIAGNOSIS — F1010 Alcohol abuse, uncomplicated: Secondary | ICD-10-CM

## 2018-11-18 DIAGNOSIS — E639 Nutritional deficiency, unspecified: Secondary | ICD-10-CM

## 2018-11-18 NOTE — Procedures (Signed)
[facility]  [address], [address]  [place], [place] [address] Tel: [phone] Fax:  [phone] Test Date:  11/18/2018  Patient: Collin Kaiser DOB: [birth year] Physician: Nita Sickle, DO  Sex: Male Height: 5\' 7"  Ref Phys: , DO  ID#: Nita Sickle Temp: 33.0C Technician:    Patient Complaints: This is a 43 year old man referred for evaluation of bilateral hand paresthesias.  NCV & EMG Findings: Extensive electrodiagnostic testing of the right upper extremity and additional studies of the left shows:  1. Bilateral median sensory responses are absent.  Bilateral ulnar sensory responses are within normal limits. 2. Bilateral median motor responses show prolonged latency (R5.5, L8.3 ms).  Bilateral ulnar sensory responses are within normal limits.  3. There is no evidence of active or chronic motor axonal loss changes affecting any of the tested muscles.  Motor unit configuration and recruitment pattern is within normal limits.    Impression: Bilateral median neuropathy at or distal to the wrist, consistent with a clinical diagnosis of carpal tunnel syndrome.  Overall, these findings are severe in degree electrically and worse on the left.   ___________________________ 46, DO    Nerve Conduction Studies Anti Sensory Summary Table   Site NR Peak (ms) Norm Peak (ms) P-T Amp (V) Norm P-T Amp  Left Median Anti Sensory (2nd Digit)  33C  Wrist NR  <3.4  >20  Right Median Anti Sensory (2nd Digit)  33C  Wrist NR  <3.4  >20  Left Ulnar Anti Sensory (5th Digit)  33C  Wrist    2.7 <3.1 29.0 >12  Right Ulnar Anti Sensory (5th Digit)  33C  Wrist    2.6 <3.1 25.3 >12   Motor Summary Table   Site NR Onset (ms) Norm Onset (ms) O-P Amp (mV) Norm O-P Amp Site1 Site2 Delta-0 (ms) Dist (cm) Vel (m/s) Norm Vel (m/s)  Left Median Motor (Abd Poll Brev)  33C  Wrist    8.3 <3.9 8.8 >6 Elbow Wrist 5.6 30.0 54 >50  Elbow    13.9  7.6         Right Median  Motor (Abd Poll Brev)  33C  Wrist    5.5 <3.9 7.0 >6 Elbow Wrist 5.4 31.0 57 >50  Elbow    10.9  6.9         Left Ulnar Motor (Abd Dig Minimi)  33C  Wrist    2.3 <3.1 12.4 >7 B Elbow Wrist 4.6 23.0 50 >50  B Elbow    6.9  10.9  A Elbow B Elbow 1.8 10.0 56 >50  A Elbow    8.7  10.2         Right Ulnar Motor (Abd Dig Minimi)  33C  Wrist    1.9 <3.1 13.3 >7 B Elbow Wrist 4.0 24.0 60 >50  B Elbow    5.9  12.1  A Elbow B Elbow 1.9 10.0 53 >50  A Elbow    7.8  12.0          EMG   Side Muscle Ins Act Fibs Psw Fasc Number Recrt Dur Dur. Amp Amp. Poly Poly. Comment  Right 1stDorInt Nml Nml Nml Nml Nml Nml Nml Nml Nml Nml Nml Nml N/A  Right Abd Poll Brev Nml Nml Nml Nml Nml Nml Nml Nml Nml Nml Nml Nml N/A  Right PronatorTeres Nml Nml Nml Nml Nml Nml Nml Nml Nml Nml Nml Nml N/A  Right Biceps Nml Nml Nml Nml Nml Nml Nml  Nml Nml Nml Nml Nml N/A  Right Triceps Nml Nml Nml Nml Nml Nml Nml Nml Nml Nml Nml Nml N/A  Right Deltoid Nml Nml Nml Nml Nml Nml Nml Nml Nml Nml Nml Nml N/A  Left 1stDorInt Nml Nml Nml Nml Nml Nml Nml Nml Nml Nml Nml Nml N/A  Left Abd Poll Brev Nml Nml Nml Nml Nml Nml Nml Nml Nml Nml Nml Nml N/A  Left PronatorTeres Nml Nml Nml Nml Nml Nml Nml Nml Nml Nml Nml Nml N/A  Left Biceps Nml Nml Nml Nml Nml Nml Nml Nml Nml Nml Nml Nml N/A  Left Triceps Nml Nml Nml Nml Nml Nml Nml Nml Nml Nml Nml Nml N/A  Left Deltoid Nml Nml Nml Nml Nml Nml Nml Nml Nml Nml Nml Nml N/A      Waveforms:

## 2018-11-18 NOTE — Patient Instructions (Addendum)
Your provider has requested that you have labwork completed today. Please go to [facility] ([id number]) on the second floor of this building before leaving the office today. You do not need to check in. If you are not called within 15 minutes please check with the front desk.  Carpal Tunnel Syndrome  Carpal tunnel syndrome is a condition that causes pain in your hand and arm. The carpal tunnel is a narrow area that is on the palm side of your wrist. Repeated wrist motion or certain diseases may cause swelling in the tunnel. This swelling can pinch the main nerve in the wrist (median nerve). What are the causes? This condition may be caused by:  Repeated wrist motions.  Wrist injuries.  Arthritis.  A sac of fluid (cyst) or abnormal growth (tumor) in the carpal tunnel.  Fluid buildup during pregnancy. Sometimes the cause is not known. What increases the risk? The following factors may make you more likely to develop this condition:  Having a job in which you move your wrist in the same way many times. This includes jobs like being a Software engineer or a Scientist, water quality.  Being a woman.  Having other health conditions, such as: ? Diabetes. ? Obesity. ? A thyroid gland that is not active enough (hypothyroidism). ? Kidney failure. What are the signs or symptoms? Symptoms of this condition include:  A tingling feeling in your fingers.  Tingling or a loss of feeling (numbness) in your hand.  Pain in your entire arm. This pain may get worse when you bend your wrist and elbow for a long time.  Pain in your wrist that goes up your arm to your shoulder.  Pain that goes down into your palm or fingers.  A weak feeling in your hands. You may find it hard to grab and hold items. You may feel worse at night. How is this diagnosed? This condition is diagnosed with a medical history and physical exam. You may also have tests, such as:  Electromyogram (EMG). This test checks the signals that  the nerves send to the muscles.  Nerve conduction study. This test checks how well signals pass through your nerves.  Imaging tests, such as X-rays, ultrasound, and MRI. These tests check for what might be the cause of your condition. How is this treated? This condition may be treated with:  Lifestyle changes. You will be asked to stop or change the activity that caused your problem.  Doing exercise and activities that make bones and muscles stronger (physical therapy).  Learning how to use your hand again (occupational therapy).  Medicines for pain and swelling (inflammation). You may have injections in your wrist.  A wrist splint.  Surgery. Follow these instructions at home: If you have a splint:  Wear the splint as told by your doctor. Remove it only as told by your doctor.  Loosen the splint if your fingers: ? Tingle. ? Lose feeling (become numb). ? Turn cold and blue.  Keep the splint clean.  If the splint is not waterproof: ? Do not let it get wet. ? Cover it with a watertight covering when you take a bath or a shower. Managing pain, stiffness, and swelling   If told, put ice on the painful area: ? If you have a removable splint, remove it as told by your doctor. ? Put ice in a plastic bag. ? Place a towel between your skin and the bag. ? Leave the ice on for 20 minutes, 2-3 times per  day. General instructions  Take over-the-counter and prescription medicines only as told by your doctor.  Rest your wrist from any activity that may cause pain. If needed, talk with your boss at work about changes that can help your wrist heal.  Do any exercises as told by your doctor, physical therapist, or occupational therapist.  Keep all follow-up visits as told by your doctor. This is important. Contact a doctor if:  You have new symptoms.  Medicine does not help your pain.  Your symptoms get worse. Get help right away if:  You have very bad numbness or tingling in  your wrist or hand. Summary  Carpal tunnel syndrome is a condition that causes pain in your hand and arm.  It is often caused by repeated wrist motions.  Lifestyle changes and medicines are used to treat this problem. Surgery may help in very bad cases.  Follow your doctor's instructions about wearing a splint, resting your wrist, keeping follow-up visits, and calling for help. This information is not intended to replace advice given to you by your health care provider. Make sure you discuss any questions you have with your health care provider. Document Released: 12/21/2010 Document Revised: 05/10/2017 Document Reviewed: 05/10/2017 Elsevier Patient Education  2020 [employer].

## 2018-11-18 NOTE — Progress Notes (Signed)
Follow-up Visit   Date: 11/18/18   Othman Masur MRN: [id number] DOB: 29-Jun-1975   Interim History: Collin Kaiser is a 43 y.o. right-handed male returning to the clinic for follow-up of bilateral hand paresthesias.  The patient was accompanied to the clinic by self.  History of present illness: Starting around 1-months ago, he began having tingling of the fingertips.  It occurs several times per week, lasting about 30-58min.  Both hands are affected the same.  Some nights, he wakes up with his hands falling asleep and often has to shake them.  He denies any hand weakness or difficulty with grasping things.  He works as a Merchandiser, retail at [employer].  He admits to binge drinking 2-3 nights per week and will finish a bottle of rum in one sitting.  This has increased over the past 6 months, he did not drink alcohol to this extent prior to [place].  Prior labs including HbA1c, vitamin B12, ANA, and TSH is normal.   UPDATE 11/18/2018:  He is here for follow-up visit.  Since his virtual visit with me in September, he has reduced alcohol consumption.  He still drink 3-4 times per week, but reduced intake to 4 shots in 1 sitting, where as previously he would finish the entire bottle.  Since doing this, his hand numbness and tingling has not occurred.  He denies any hand weakness or new neurological symptoms.  Medications:  Current Outpatient Medications on File Prior to Visit  Medication Sig Dispense Refill  . aspirin EC 81 MG tablet Take 81 mg by mouth 2 (two) times daily.    Marland Kitchen EPINEPHrine 0.3 mg/0.3 mL IJ SOAJ injection Inject 0.3 mg into the muscle once as needed. Allergic reaction    . hydrOXYzine (ATARAX/VISTARIL) 25 MG tablet Take 1 tablet (25 mg total) by mouth 3 (three) times daily as needed for anxiety. 30 tablet 1  . menthol-cetylpyridinium (CEPACOL) 3 MG lozenge Take 1 lozenge by mouth as needed for sore throat.    . multivitamin (ONE-A-DAY MEN'S) TABS tablet Take 1  tablet by mouth daily.     No current facility-administered medications on file prior to visit.     Allergies:  Allergies  Allergen Reactions  . Shellfish Allergy Anaphylaxis  . Lactose Diarrhea  . Tamiflu [Oseltamivir Phosphate] Swelling    Tongue swelling    Review of Systems:  CONSTITUTIONAL: No fevers, chills, night sweats, or weight loss.  EYES: No visual changes or eye pain ENT: No hearing changes.  No history of nose bleeds.   RESPIRATORY: No cough, wheezing and shortness of breath.   CARDIOVASCULAR: Negative for chest pain, and palpitations.   GI: Negative for abdominal discomfort, blood in stools or black stools.  No recent change in bowel habits.   GU:  No history of incontinence.   MUSCLOSKELETAL: No history of joint pain or swelling.  No myalgias.   SKIN: Negative for lesions, rash, and itching.   ENDOCRINE: Negative for cold or heat intolerance, polydipsia or goiter.   PSYCH:  No depression or anxiety symptoms.   NEURO: As Above.   Vital Signs:  BP 140/90   Pulse 97   Ht 5\' 8"  (1.727 m)   Wt 213 lb (96.6 kg)   SpO2 97%   BMI 32.39 kg/m   Neurological Exam: MENTAL STATUS including orientation to time, place, person, recent and remote memory, attention span and concentration, language, and fund of knowledge is normal.  Speech is not dysarthric.  CRANIAL  NERVES:  No visual field defects.  Pupils equal round and reactive to light.  Normal conjugate, extra-ocular eye movements in all directions of gaze.  No ptosis   MOTOR:  Motor strength is 5/5 in all extremities, including bilateral ABP.  No atrophy, fasciculations or abnormal movements.  No pronator drift.  Tone is normal.    MSRs:  Reflexes are 2+/4 throughout.  SENSORY:  Intact to vibration throughout .  COORDINATION/GAIT: Gait narrow based and stable.   Data: NCS/EMG of the arms 11/18/2018:  Bilateral median neuropathy at or distal to the wrist, consistent with a clinical diagnosis of carpal tunnel  syndrome. Overall, these findings are severe in degree electrically and worse on the  left.   IMPRESSION/PLAN: 1.  Bilateral carpal tunnel syndrome, severe and worse on the left.   - Patient reports hand paresthesias have resolved since reducing his alcohol intake  - Start wearing wrist splints at night due to severity of symptoms, refer to [facility]   - If his hand paresthesias get worse, low threshold to refer to hand specialist  2.  Alcohol abuse  - Counseled on alcohol cessations and continued to encourage him to to cut back  - Check MMA, folate, and vitamin B1  Return to clinic, if symptoms get worse  Thank you for allowing me to participate in patient's care.  If I can answer any additional questions, I would be pleased to do so.    Sincerely,    Donika K. Allena Katz, DO

## 2018-11-20 ENCOUNTER — Encounter: Payer: Self-pay | Admitting: Family Medicine

## 2018-11-20 DIAGNOSIS — G5603 Carpal tunnel syndrome, bilateral upper limbs: Secondary | ICD-10-CM | POA: Insufficient documentation

## 2018-11-21 LAB — VITAMIN B1: Vitamin B1 (Thiamine): 20 nmol/L (ref 8–30)

## 2018-11-21 LAB — FOLATE: Folate: >24 ng/mL

## 2018-11-21 LAB — METHYLMALONIC ACID, SERUM: Methylmalonic Acid, Quant: 96 nmol/L (ref 87–318)

## 2018-12-19 ENCOUNTER — Encounter (HOSPITAL_COMMUNITY): Payer: Self-pay | Admitting: Emergency Medicine

## 2018-12-19 ENCOUNTER — Emergency Department (HOSPITAL_COMMUNITY): Payer: No Typology Code available for payment source

## 2018-12-19 ENCOUNTER — Other Ambulatory Visit: Payer: Self-pay

## 2018-12-19 ENCOUNTER — Emergency Department (HOSPITAL_COMMUNITY)
Admission: EM | Admit: 2018-12-19 | Discharge: 2018-12-19 | Disposition: A | Discharge status: Home / Self Care | Payer: No Typology Code available for payment source | Attending: Emergency Medicine | Admitting: Emergency Medicine

## 2018-12-19 DIAGNOSIS — Z7982 Long term (current) use of aspirin: Secondary | ICD-10-CM | POA: Diagnosis not present

## 2018-12-19 DIAGNOSIS — R0600 Dyspnea, unspecified: Secondary | ICD-10-CM | POA: Diagnosis not present

## 2018-12-19 DIAGNOSIS — R61 Generalized hyperhidrosis: Secondary | ICD-10-CM | POA: Insufficient documentation

## 2018-12-19 DIAGNOSIS — Z79899 Other long term (current) drug therapy: Secondary | ICD-10-CM | POA: Diagnosis not present

## 2018-12-19 DIAGNOSIS — R079 Chest pain, unspecified: Secondary | ICD-10-CM | POA: Insufficient documentation

## 2018-12-19 LAB — CBC
HCT: 45.2 % (ref 39.0–52.0)
Hemoglobin: 14.8 g/dL (ref 13.0–17.0)
MCH: 31.1 pg (ref 26.0–34.0)
MCHC: 32.7 g/dL (ref 30.0–36.0)
MCV: 95 fL (ref 80.0–100.0)
Platelets: 244 10*3/uL (ref 150–400)
RBC: 4.76 MIL/uL (ref 4.22–5.81)
RDW: 11.5 % (ref 11.5–15.5)
WBC: 4.8 10*3/uL (ref 4.0–10.5)
nRBC: 0 % (ref 0.0–0.2)

## 2018-12-19 LAB — BASIC METABOLIC PANEL
Anion gap: 9 (ref 5–15)
BUN: 17 mg/dL (ref 6–20)
CO2: 26 mmol/L (ref 22–32)
Calcium: 9.9 mg/dL (ref 8.9–10.3)
Chloride: 101 mmol/L (ref 98–111)
Creatinine, Ser: 0.98 mg/dL (ref 0.61–1.24)
GFR calc Af Amer: >60 mL/min (ref >60)
GFR calc non Af Amer: >60 mL/min (ref >60)
Glucose, Bld: 136 mg/dL — ABNORMAL HIGH (ref 70–99)
Potassium: 5.3 mmol/L — ABNORMAL HIGH (ref 3.5–5.1)
Sodium: 136 mmol/L (ref 135–145)

## 2018-12-19 LAB — TROPONIN I (HIGH SENSITIVITY)
Troponin I (High Sensitivity): 8 ng/L (ref <18)
Troponin I (High Sensitivity): 7 ng/L (ref <18)

## 2018-12-19 MED ORDER — SODIUM CHLORIDE 0.9% FLUSH: 3.0000 mL | Freq: Once | INTRAVENOUS | Status: DC

## 2018-12-19 NOTE — Discharge Instructions (Addendum)
Test showed no evidence of a heart attack.  Potassium was slightly elevated today (5.3).  This will need to be rechecked in a few weeks.  Recommend checking your blood pressure several times and recording the values.  If symptoms persist, would recommend seeing your primary care doctor for a possible cardiology referral.  Try to lose weight.  Reduce sodium.  Exercise.

## 2018-12-19 NOTE — ED Triage Notes (Signed)
Pt here with c/o chest pain that radiates across his chest and some times goes down his right arm pt did take advil and it helped the pain
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
## 2019-01-14 ENCOUNTER — Telehealth: Payer: Self-pay | Admitting: Family Medicine

## 2019-01-14 ENCOUNTER — Other Ambulatory Visit: Payer: Self-pay

## 2019-01-14 NOTE — Telephone Encounter (Signed)
Please advise if patient can be worked in.  Copied from [place] [id number]. Topic: Appointment Scheduling - Prior Auth Required for Appointment >> Jan 14, 2019 10:38 AM Robina Ade, Helene Kelp D wrote: No appointment has been scheduled. Patient is requesting same day appointment work-in for back pain. Per scheduling protocol, this appointment requires a prior authorization prior to scheduling.  Route to department's PEC pool.

## 2019-01-14 NOTE — Telephone Encounter (Signed)
Tried to call the patient to get him schedule for a office visit with Dr. Rogers Blocker for tomorrow.

## 2019-01-14 NOTE — Telephone Encounter (Signed)
I can not see him today but if we have anything tomorrow you can put him in. Otherwise schedule with another provider. Thanks so much for asking.   Orma Flaming, MD [place]

## 2019-01-15 ENCOUNTER — Encounter: Payer: Self-pay | Admitting: Family Medicine

## 2019-01-15 ENCOUNTER — Ambulatory Visit: Payer: No Typology Code available for payment source | Admitting: Family Medicine

## 2019-01-15 ENCOUNTER — Ambulatory Visit (INDEPENDENT_AMBULATORY_CARE_PROVIDER_SITE_OTHER): Payer: No Typology Code available for payment source | Admitting: Family Medicine

## 2019-01-15 VITALS — BP 140/90 | HR 77 | Temp 97.4°F | Ht 68.0 in | Wt 218.4 lb

## 2019-01-15 DIAGNOSIS — E875 Hyperkalemia: Secondary | ICD-10-CM | POA: Diagnosis not present

## 2019-01-15 DIAGNOSIS — M545 Low back pain, unspecified: Secondary | ICD-10-CM

## 2019-01-15 LAB — BASIC METABOLIC PANEL
BUN: 15 mg/dL (ref 6–23)
CO2: 29 mEq/L (ref 19–32)
Calcium: 9.6 mg/dL (ref 8.4–10.5)
Chloride: 100 mEq/L (ref 96–112)
Creatinine, Ser: 1.01 mg/dL (ref 0.40–1.50)
GFR: 97.55 mL/min (ref >60.00)
Glucose, Bld: 107 mg/dL — ABNORMAL HIGH (ref 70–99)
Potassium: 4 mEq/L (ref 3.5–5.1)
Sodium: 136 mEq/L (ref 135–145)

## 2019-01-15 MED ORDER — CYCLOBENZAPRINE HCL 10 MG PO TABS: 10.0000 mg | ORAL_TABLET | Freq: Three times a day (TID) | ORAL | 0 refills | Status: DC | PRN

## 2019-01-15 MED ORDER — IBUPROFEN 600 MG PO TABS: 600.0000 mg | ORAL_TABLET | Freq: Three times a day (TID) | ORAL | 0 refills | Status: DC | PRN

## 2019-01-15 NOTE — Patient Instructions (Signed)
For pain: sending in ibuprofen high dose. This is an anti inflammatory. Take with food and drinks lots of water. Can take up to three times a day as needed for pain.   Muscle relaxer is called flexeril. Can take up to three times a day as needed for muscle spasms/back pain. May start out with half a pill as will make you very drowsy.   If not better in 2 weeks or worsening symptoms, we will have to xray you and figure out if more imaging is warranted.     Acute Back Pain, Adult Acute back pain is sudden and usually short-lived. It is often caused by an injury to the muscles and tissues in the back. The injury may result from:  A muscle or ligament getting overstretched or torn (strained). Ligaments are tissues that connect bones to each other. Lifting something improperly can cause a back strain.  Wear and tear (degeneration) of the spinal disks. Spinal disks are circular tissue that provides cushioning between the bones of the spine (vertebrae).  Twisting motions, such as while playing sports or doing yard work.  A hit to the back.  Arthritis. You may have a physical exam, lab tests, and imaging tests to find the cause of your pain. Acute back pain usually goes away with rest and home care. Follow these instructions at home: Managing pain, stiffness, and swelling  Take over-the-counter and prescription medicines only as told by your health care provider.  Your health care provider may recommend applying ice during the first 24-48 hours after your pain starts. To do this: ? Put ice in a plastic bag. ? Place a towel between your skin and the bag. ? Leave the ice on for 20 minutes, 2-3 times a day.  If directed, apply heat to the affected area as often as told by your health care provider. Use the heat source that your health care provider recommends, such as a moist heat pack or a heating pad. ? Place a towel between your skin and the heat source. ? Leave the heat on for 20-30  minutes. ? Remove the heat if your skin turns bright red. This is especially important if you are unable to feel pain, heat, or cold. You have a greater risk of getting burned. Activity   Do not stay in bed. Staying in bed for more than 1-2 days can delay your recovery.  Sit up and stand up straight. Avoid leaning forward when you sit, or hunching over when you stand. ? If you work at a desk, sit close to it so you do not need to lean over. Keep your chin tucked in. Keep your neck drawn back, and keep your elbows bent at a right angle. Your arms should look like the letter "L." ? Sit high and close to the steering wheel when you drive. Add lower back (lumbar) support to your car seat, if needed.  Take short walks on even surfaces as soon as you are able. Try to increase the length of time you walk each day.  Do not sit, drive, or stand in one place for more than 30 minutes at a time. Sitting or standing for long periods of time can put stress on your back.  Do not drive or use heavy machinery while taking prescription pain medicine.  Use proper lifting techniques. When you bend and lift, use positions that put less stress on your back: ? [place] your knees. ? Keep the load close to your body. ?  Avoid twisting.  Exercise regularly as told by your health care provider. Exercising helps your back heal faster and helps prevent back injuries by keeping muscles strong and flexible.  Work with a physical therapist to make a safe exercise program, as recommended by your health care provider. Do any exercises as told by your physical therapist. Lifestyle  Maintain a healthy weight. Extra weight puts stress on your back and makes it difficult to have good posture.  Avoid activities or situations that make you feel anxious or stressed. Stress and anxiety increase muscle tension and can make back pain worse. Learn ways to manage anxiety and stress, such as through exercise. General  instructions  Sleep on a firm mattress in a comfortable position. Try lying on your side with your knees slightly bent. If you lie on your back, put a pillow under your knees.  Follow your treatment plan as told by your health care provider. This may include: ? Cognitive or behavioral therapy. ? Acupuncture or massage therapy. ? Meditation or yoga. Contact a health care provider if:  You have pain that is not relieved with rest or medicine.  You have increasing pain going down into your legs or buttocks.  Your pain does not improve after 2 weeks.  You have pain at night.  You lose weight without trying.  You have a fever or chills. Get help right away if:  You develop new bowel or bladder control problems.  You have unusual weakness or numbness in your arms or legs.  You develop nausea or vomiting.  You develop abdominal pain.  You feel faint. Summary  Acute back pain is sudden and usually short-lived.  Use proper lifting techniques. When you bend and lift, use positions that put less stress on your back.  Take over-the-counter and prescription medicines and apply heat or ice as directed by your health care provider. This information is not intended to replace advice given to you by your health care provider. Make sure you discuss any questions you have with your health care provider. Document Revised: 04/22/2018 Document Reviewed: 08/15/2016 Elsevier Patient Education  2020 [employer].    Low Back Sprain or Strain Rehab Ask your health care provider which exercises are safe for you. Do exercises exactly as told by your health care provider and adjust them as directed. It is normal to feel mild stretching, pulling, tightness, or discomfort as you do these exercises. Stop right away if you feel sudden pain or your pain gets worse. Do not begin these exercises until told by your health care provider. Stretching and range-of-motion exercises These exercises warm up  your muscles and joints and improve the movement and flexibility of your back. These exercises also help to relieve pain, numbness, and tingling. Lumbar rotation  1. Lie on your back on a firm surface and bend your knees. 2. Straighten your arms out to your sides so each arm forms a 90-degree angle (right angle) with a side of your body. 3. Slowly move (rotate) both of your knees to one side of your body until you feel a stretch in your lower back (lumbar). Try not to let your shoulders lift off the floor. 4. Hold this position for __________ seconds. 5. Tense your abdominal muscles and slowly move your knees back to the starting position. 6. Repeat this exercise on the other side of your body. Repeat __________ times. Complete this exercise __________ times a day. Single knee to chest  1. Lie on your back on  a firm surface with both legs straight. 2. Bend one of your knees. Use your hands to move your knee up toward your chest until you feel a gentle stretch in your lower back and buttock. ? Hold your leg in this position by holding on to the front of your knee. ? Keep your other leg as straight as possible. 3. Hold this position for __________ seconds. 4. Slowly return to the starting position. 5. Repeat with your other leg. Repeat __________ times. Complete this exercise __________ times a day. Prone extension on elbows  1. Lie on your abdomen on a firm surface (prone position). 2. Prop yourself up on your elbows. 3. Use your arms to help lift your chest up until you feel a gentle stretch in your abdomen and your lower back. ? This will place some of your body weight on your elbows. If this is uncomfortable, try stacking pillows under your chest. ? Your hips should stay down, against the surface that you are lying on. Keep your hip and back muscles relaxed. 4. Hold this position for __________ seconds. 5. Slowly relax your upper body and return to the starting position. Repeat  __________ times. Complete this exercise __________ times a day. Strengthening exercises These exercises build strength and endurance in your back. Endurance is the ability to use your muscles for a long time, even after they get tired. Pelvic tilt This exercise strengthens the muscles that lie deep in the abdomen. 1. Lie on your back on a firm surface. Bend your knees and keep your feet flat on the floor. 2. Tense your abdominal muscles. Tip your pelvis up toward the ceiling and flatten your lower back into the floor. ? To help with this exercise, you may place a small towel under your lower back and try to push your back into the towel. 3. Hold this position for __________ seconds. 4. Let your muscles relax completely before you repeat this exercise. Repeat __________ times. Complete this exercise __________ times a day. Alternating arm and leg raises  1. Get on your hands and knees on a firm surface. If you are on a hard floor, you may want to use padding, such as an exercise mat, to cushion your knees. 2. Line up your arms and legs. Your hands should be directly below your shoulders, and your knees should be directly below your hips. 3. Lift your left leg behind you. At the same time, raise your right arm and straighten it in front of you. ? Do not lift your leg higher than your hip. ? Do not lift your arm higher than your shoulder. ? Keep your abdominal and back muscles tight. ? Keep your hips facing the ground. ? Do not arch your back. ? Keep your balance carefully, and do not hold your breath. 4. Hold this position for __________ seconds. 5. Slowly return to the starting position. 6. Repeat with your right leg and your left arm. Repeat __________ times. Complete this exercise __________ times a day. Abdominal set with straight leg raise  1. Lie on your back on a firm surface. 2. Bend one of your knees and keep your other leg straight. 3. Tense your abdominal muscles and lift your  straight leg up, 4-6 inches (10-15 cm) off the ground. 4. Keep your abdominal muscles tight and hold this position for __________ seconds. ? Do not hold your breath. ? Do not arch your back. Keep it flat against the ground. 5. Keep your abdominal muscles tense as  you slowly lower your leg back to the starting position. 6. Repeat with your other leg. Repeat __________ times. Complete this exercise __________ times a day. Single leg lower with bent knees 1. Lie on your back on a firm surface. 2. Tense your abdominal muscles and lift your feet off the floor, one foot at a time, so your knees and hips are bent in 90-degree angles (right angles). ? Your knees should be over your hips and your lower legs should be parallel to the floor. 3. Keeping your abdominal muscles tense and your knee bent, slowly lower one of your legs so your toe touches the ground. 4. Lift your leg back up to return to the starting position. ? Do not hold your breath. ? Do not let your back arch. Keep your back flat against the ground. 5. Repeat with your other leg. Repeat __________ times. Complete this exercise __________ times a day. Posture and body mechanics Good posture and healthy body mechanics can help to relieve stress in your body's tissues and joints. Body mechanics refers to the movements and positions of your body while you do your daily activities. Posture is part of body mechanics. Good posture means:  Your spine is in its natural S-curve position (neutral).  Your shoulders are pulled back slightly.  Your head is not tipped forward. Follow these guidelines to improve your posture and body mechanics in your everyday activities. Standing   When standing, keep your spine neutral and your feet about hip width apart. Keep a slight bend in your knees. Your ears, shoulders, and hips should line up.  When you do a task in which you stand in one place for a long time, place one foot up on a stable object that  is 2-4 inches (5-10 cm) high, such as a footstool. This helps keep your spine neutral. Sitting   When sitting, keep your spine neutral and keep your feet flat on the floor. Use a footrest, if necessary, and keep your thighs parallel to the floor. Avoid rounding your shoulders, and avoid tilting your head forward.  When working at a desk or a computer, keep your desk at a height where your hands are slightly lower than your elbows. Slide your chair under your desk so you are close enough to maintain good posture.  When working at a computer, place your monitor at a height where you are looking straight ahead and you do not have to tilt your head forward or downward to look at the screen. Resting  When lying down and resting, avoid positions that are most painful for you.  If you have pain with activities such as sitting, bending, stooping, or squatting, lie in a position in which your body does not bend very much. For example, avoid curling up on your side with your arms and knees near your chest (fetal position).  If you have pain with activities such as standing for a long time or reaching with your arms, lie with your spine in a neutral position and bend your knees slightly. Try the following positions: ? Lying on your side with a pillow between your knees. ? Lying on your back with a pillow under your knees. Lifting   When lifting objects, keep your feet at least shoulder width apart and tighten your abdominal muscles.  Bend your knees and hips and keep your spine neutral. It is important to lift using the strength of your legs, not your back. Do not lock your knees straight out.  Always ask for help to lift heavy or awkward objects. This information is not intended to replace advice given to you by your health care provider. Make sure you discuss any questions you have with your health care provider. Document Revised: 04/25/2018 Document Reviewed: 01/23/2018 Elsevier Patient Education   [place].
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
## 2019-01-18 ENCOUNTER — Encounter: Payer: Self-pay | Admitting: Family Medicine

## 2019-06-22 ENCOUNTER — Telehealth: Payer: Self-pay | Admitting: Family Medicine

## 2019-06-22 ENCOUNTER — Encounter: Payer: Self-pay | Admitting: Family Medicine

## 2019-06-22 ENCOUNTER — Ambulatory Visit (INDEPENDENT_AMBULATORY_CARE_PROVIDER_SITE_OTHER): Payer: No Typology Code available for payment source | Admitting: Family Medicine

## 2019-06-22 ENCOUNTER — Ambulatory Visit (INDEPENDENT_AMBULATORY_CARE_PROVIDER_SITE_OTHER)
Admission: RE | Admit: 2019-06-22 | Discharge: 2019-06-22 | Disposition: A | Discharge status: Home / Self Care | Payer: No Typology Code available for payment source | Source: Ambulatory Visit | Attending: Family Medicine | Admitting: Family Medicine

## 2019-06-22 ENCOUNTER — Other Ambulatory Visit: Payer: Self-pay

## 2019-06-22 VITALS — BP 128/60 | HR 81 | Temp 97.5°F | Ht 68.0 in | Wt 209.0 lb

## 2019-06-22 DIAGNOSIS — M5441 Lumbago with sciatica, right side: Secondary | ICD-10-CM

## 2019-06-22 LAB — BASIC METABOLIC PANEL
BUN: 14 mg/dL (ref 6–23)
CO2: 27 mEq/L (ref 19–32)
Calcium: 9.5 mg/dL (ref 8.4–10.5)
Chloride: 102 mEq/L (ref 96–112)
Creatinine, Ser: 0.99 mg/dL (ref 0.40–1.50)
GFR: 99.63 mL/min (ref >60.00)
Glucose, Bld: 94 mg/dL (ref 70–99)
Potassium: 4.1 mEq/L (ref 3.5–5.1)
Sodium: 135 mEq/L (ref 135–145)

## 2019-06-22 MED ORDER — BACLOFEN 10 MG PO TABS: 10.0000 mg | ORAL_TABLET | Freq: Three times a day (TID) | ORAL | 0 refills | Status: DC

## 2019-06-22 MED ORDER — PREDNISONE 20 MG PO TABS: 40.0000 mg | ORAL_TABLET | Freq: Every day | ORAL | 0 refills | Status: DC
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
## 2019-06-22 NOTE — Telephone Encounter (Signed)
I spoke with pt to give him the message below. Pt verbalized understanding.

## 2019-06-22 NOTE — Telephone Encounter (Signed)
Please let him know back xray looks good. No acute or abnormal finding, but radiologist will read it and he will get an official notification.   Hopefully medicine and physical therapy will help.   Dr. Artis Flock

## 2019-06-22 NOTE — Patient Instructions (Signed)
1) muscle relaxer sent in. Can take as needed up to three times a day 2) steroid burst. Will help inflammation and if nerve irritated 3) try ibuprofen 600mg  as needed up to three times a day with food.   4) start exercises and Physical therapy referral done.  5) if not getting better or worsening we will need to check MRI of your back so keep me in the loop.   Low Back Sprain or Strain Rehab Ask your health care provider which exercises are safe for you. Do exercises exactly as told by your health care provider and adjust them as directed. It is normal to feel mild stretching, pulling, tightness, or discomfort as you do these exercises. Stop right away if you feel sudden pain or your pain gets worse. Do not begin these exercises until told by your health care provider. Stretching and range-of-motion exercises These exercises warm up your muscles and joints and improve the movement and flexibility of your back. These exercises also help to relieve pain, numbness, and tingling. Lumbar rotation  1. Lie on your back on a firm surface and bend your knees. 2. Straighten your arms out to your sides so each arm forms a 90-degree angle (right angle) with a side of your body. 3. Slowly move (rotate) both of your knees to one side of your body until you feel a stretch in your lower back (lumbar). Try not to let your shoulders lift off the floor. 4. Hold this position for __________ seconds. 5. Tense your abdominal muscles and slowly move your knees back to the starting position. 6. Repeat this exercise on the other side of your body. Repeat __________ times. Complete this exercise __________ times a day. Single knee to chest  1. Lie on your back on a firm surface with both legs straight. 2. Bend one of your knees. Use your hands to move your knee up toward your chest until you feel a gentle stretch in your lower back and buttock. ? Hold your leg in this position by holding on to the front of your  knee. ? Keep your other leg as straight as possible. 3. Hold this position for __________ seconds. 4. Slowly return to the starting position. 5. Repeat with your other leg. Repeat __________ times. Complete this exercise __________ times a day. Prone extension on elbows  1. Lie on your abdomen on a firm surface (prone position). 2. Prop yourself up on your elbows. 3. Use your arms to help lift your chest up until you feel a gentle stretch in your abdomen and your lower back. ? This will place some of your body weight on your elbows. If this is uncomfortable, try stacking pillows under your chest. ? Your hips should stay down, against the surface that you are lying on. Keep your hip and back muscles relaxed. 4. Hold this position for __________ seconds. 5. Slowly relax your upper body and return to the starting position. Repeat __________ times. Complete this exercise __________ times a day. Strengthening exercises These exercises build strength and endurance in your back. Endurance is the ability to use your muscles for a long time, even after they get tired. Pelvic tilt This exercise strengthens the muscles that lie deep in the abdomen. 1. Lie on your back on a firm surface. Bend your knees and keep your feet flat on the floor. 2. Tense your abdominal muscles. Tip your pelvis up toward the ceiling and flatten your lower back into the floor. ? To help with  this exercise, you may place a small towel under your lower back and try to push your back into the towel. 3. Hold this position for __________ seconds. 4. Let your muscles relax completely before you repeat this exercise. Repeat __________ times. Complete this exercise __________ times a day. Alternating arm and leg raises  1. Get on your hands and knees on a firm surface. If you are on a hard floor, you may want to use padding, such as an exercise mat, to cushion your knees. 2. Line up your arms and legs. Your hands should be  directly below your shoulders, and your knees should be directly below your hips. 3. Lift your left leg behind you. At the same time, raise your right arm and straighten it in front of you. ? Do not lift your leg higher than your hip. ? Do not lift your arm higher than your shoulder. ? Keep your abdominal and back muscles tight. ? Keep your hips facing the ground. ? Do not arch your back. ? Keep your balance carefully, and do not hold your breath. 4. Hold this position for __________ seconds. 5. Slowly return to the starting position. 6. Repeat with your right leg and your left arm. Repeat __________ times. Complete this exercise __________ times a day. Abdominal set with straight leg raise  1. Lie on your back on a firm surface. 2. Bend one of your knees and keep your other leg straight. 3. Tense your abdominal muscles and lift your straight leg up, 4-6 inches (10-15 cm) off the ground. 4. Keep your abdominal muscles tight and hold this position for __________ seconds. ? Do not hold your breath. ? Do not arch your back. Keep it flat against the ground. 5. Keep your abdominal muscles tense as you slowly lower your leg back to the starting position. 6. Repeat with your other leg. Repeat __________ times. Complete this exercise __________ times a day. Single leg lower with bent knees 1. Lie on your back on a firm surface. 2. Tense your abdominal muscles and lift your feet off the floor, one foot at a time, so your knees and hips are bent in 90-degree angles (right angles). ? Your knees should be over your hips and your lower legs should be parallel to the floor. 3. Keeping your abdominal muscles tense and your knee bent, slowly lower one of your legs so your toe touches the ground. 4. Lift your leg back up to return to the starting position. ? Do not hold your breath. ? Do not let your back arch. Keep your back flat against the ground. 5. Repeat with your other leg. Repeat __________  times. Complete this exercise __________ times a day. Posture and body mechanics Good posture and healthy body mechanics can help to relieve stress in your body's tissues and joints. Body mechanics refers to the movements and positions of your body while you do your daily activities. Posture is part of body mechanics. Good posture means:  Your spine is in its natural S-curve position (neutral).  Your shoulders are pulled back slightly.  Your head is not tipped forward. Follow these guidelines to improve your posture and body mechanics in your everyday activities. Standing   When standing, keep your spine neutral and your feet about hip width apart. Keep a slight bend in your knees. Your ears, shoulders, and hips should line up.  When you do a task in which you stand in one place for a long time, place one foot  up on a stable object that is 2-4 inches (5-10 cm) high, such as a footstool. This helps keep your spine neutral. Sitting   When sitting, keep your spine neutral and keep your feet flat on the floor. Use a footrest, if necessary, and keep your thighs parallel to the floor. Avoid rounding your shoulders, and avoid tilting your head forward.  When working at a desk or a computer, keep your desk at a height where your hands are slightly lower than your elbows. Slide your chair under your desk so you are close enough to maintain good posture.  When working at a computer, place your monitor at a height where you are looking straight ahead and you do not have to tilt your head forward or downward to look at the screen. Resting  When lying down and resting, avoid positions that are most painful for you.  If you have pain with activities such as sitting, bending, stooping, or squatting, lie in a position in which your body does not bend very much. For example, avoid curling up on your side with your arms and knees near your chest (fetal position).  If you have pain with activities such as  standing for a long time or reaching with your arms, lie with your spine in a neutral position and bend your knees slightly. Try the following positions: ? Lying on your side with a pillow between your knees. ? Lying on your back with a pillow under your knees. Lifting   When lifting objects, keep your feet at least shoulder width apart and tighten your abdominal muscles.  Bend your knees and hips and keep your spine neutral. It is important to lift using the strength of your legs, not your back. Do not lock your knees straight out.  Always ask for help to lift heavy or awkward objects. This information is not intended to replace advice given to you by your health care provider. Make sure you discuss any questions you have with your health care provider. Document Revised: 04/25/2018 Document Reviewed: 01/23/2018 Elsevier Patient Education  [place].

## 2019-07-07 ENCOUNTER — Other Ambulatory Visit: Payer: Self-pay

## 2019-07-07 ENCOUNTER — Ambulatory Visit (INDEPENDENT_AMBULATORY_CARE_PROVIDER_SITE_OTHER): Payer: No Typology Code available for payment source | Admitting: Physical Therapy

## 2019-07-07 ENCOUNTER — Encounter: Payer: Self-pay | Admitting: Physical Therapy

## 2019-07-07 DIAGNOSIS — M5416 Radiculopathy, lumbar region: Secondary | ICD-10-CM | POA: Diagnosis not present

## 2019-07-07 DIAGNOSIS — M5441 Lumbago with sciatica, right side: Secondary | ICD-10-CM | POA: Diagnosis not present

## 2019-07-07 NOTE — Patient Instructions (Signed)
Access Code: [email or web address] URL: [email or web address] Date: 07/07/2019 Prepared by: Sedalia Muta  Exercises Prone Press Up on Elbows - 3 x daily - 2 sets - 10 reps - 5 hold Standing Lumbar Extension - 5 x daily - 1-2 sets - 10 reps Supine Transversus Abdominis Bracing - Hands on Ground - 1 x daily - 2 sets - 10 reps Supine March - 2 x daily - 2 sets - 10 reps
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
## 2019-07-14 ENCOUNTER — Encounter: Payer: No Typology Code available for payment source | Admitting: Physical Therapy

## 2019-07-21 ENCOUNTER — Encounter: Payer: No Typology Code available for payment source | Admitting: Physical Therapy

## 2019-07-21 DIAGNOSIS — Z0289 Encounter for other administrative examinations: Secondary | ICD-10-CM

## 2019-07-27 ENCOUNTER — Encounter: Payer: No Typology Code available for payment source | Admitting: Physical Therapy

## 2019-07-28 ENCOUNTER — Encounter: Payer: No Typology Code available for payment source | Admitting: Physical Therapy

## 2019-11-12 ENCOUNTER — Encounter (HOSPITAL_COMMUNITY): Payer: Self-pay | Admitting: Emergency Medicine

## 2019-11-12 ENCOUNTER — Other Ambulatory Visit: Payer: Self-pay

## 2019-11-12 ENCOUNTER — Emergency Department (HOSPITAL_COMMUNITY): Payer: No Typology Code available for payment source

## 2019-11-12 ENCOUNTER — Emergency Department (HOSPITAL_COMMUNITY)
Admission: EM | Admit: 2019-11-12 | Discharge: 2019-11-13 | Disposition: A | Discharge status: Home / Self Care | Payer: No Typology Code available for payment source | Attending: Emergency Medicine | Admitting: Emergency Medicine

## 2019-11-12 DIAGNOSIS — R202 Paresthesia of skin: Secondary | ICD-10-CM | POA: Diagnosis not present

## 2019-11-12 DIAGNOSIS — R079 Chest pain, unspecified: Secondary | ICD-10-CM | POA: Insufficient documentation

## 2019-11-12 DIAGNOSIS — R519 Headache, unspecified: Secondary | ICD-10-CM | POA: Diagnosis not present

## 2019-11-12 DIAGNOSIS — Z7982 Long term (current) use of aspirin: Secondary | ICD-10-CM | POA: Diagnosis not present

## 2019-11-12 LAB — CBC
HCT: 40.3 % (ref 39.0–52.0)
Hemoglobin: 13.4 g/dL (ref 13.0–17.0)
MCH: 31.1 pg (ref 26.0–34.0)
MCHC: 33.3 g/dL (ref 30.0–36.0)
MCV: 93.5 fL (ref 80.0–100.0)
Platelets: 285 10*3/uL (ref 150–400)
RBC: 4.31 MIL/uL (ref 4.22–5.81)
RDW: 11.2 % — ABNORMAL LOW (ref 11.5–15.5)
WBC: 5.8 10*3/uL (ref 4.0–10.5)
nRBC: 0 % (ref 0.0–0.2)

## 2019-11-12 LAB — BASIC METABOLIC PANEL
Anion gap: 11 (ref 5–15)
BUN: 16 mg/dL (ref 6–20)
CO2: 23 mmol/L (ref 22–32)
Calcium: 9.3 mg/dL (ref 8.9–10.3)
Chloride: 102 mmol/L (ref 98–111)
Creatinine, Ser: 0.94 mg/dL (ref 0.61–1.24)
GFR, Estimated: >60 mL/min (ref >60)
Glucose, Bld: 102 mg/dL — ABNORMAL HIGH (ref 70–99)
Potassium: 4.3 mmol/L (ref 3.5–5.1)
Sodium: 136 mmol/L (ref 135–145)

## 2019-11-12 LAB — TROPONIN I (HIGH SENSITIVITY): Troponin I (High Sensitivity): 5 ng/L (ref <18)

## 2019-11-12 NOTE — ED Triage Notes (Signed)
Pt c/o left arm numbness that started today when he woke up. Pt states he is having SOB and cp.

## 2019-11-13 LAB — TROPONIN I (HIGH SENSITIVITY): Troponin I (High Sensitivity): 6 ng/L (ref <18)
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
## 2019-11-13 NOTE — Discharge Instructions (Signed)
Your evaluation today did not show any signs of any serious problems.  However, if you have ongoing concerns, consider following up with the neurologist to evaluate your headache and numbness, and with a cardiologist to evaluate your chest pain.

## 2019-12-21 ENCOUNTER — Other Ambulatory Visit: Payer: Self-pay

## 2019-12-21 ENCOUNTER — Ambulatory Visit (INDEPENDENT_AMBULATORY_CARE_PROVIDER_SITE_OTHER): Payer: No Typology Code available for payment source | Admitting: Family Medicine

## 2019-12-21 ENCOUNTER — Telehealth: Payer: No Typology Code available for payment source | Admitting: Family Medicine

## 2019-12-21 ENCOUNTER — Encounter: Payer: Self-pay | Admitting: Family Medicine

## 2019-12-21 VITALS — Ht 68.0 in | Wt 213.0 lb

## 2019-12-21 VITALS — BP 130/76 | HR 79 | Temp 98.1°F | Ht 68.0 in | Wt 213.0 lb

## 2019-12-21 DIAGNOSIS — R091 Pleurisy: Secondary | ICD-10-CM | POA: Diagnosis not present

## 2019-12-21 DIAGNOSIS — J208 Acute bronchitis due to other specified organisms: Secondary | ICD-10-CM

## 2019-12-21 MED ORDER — BENZONATATE 200 MG PO CAPS: 200.0000 mg | ORAL_CAPSULE | Freq: Two times a day (BID) | ORAL | 0 refills | Status: DC | PRN

## 2019-12-21 MED ORDER — ALBUTEROL SULFATE HFA 108 (90 BASE) MCG/ACT IN AERS: 1.0000 | INHALATION_SPRAY | Freq: Four times a day (QID) | RESPIRATORY_TRACT | 0 refills | Status: AC | PRN

## 2019-12-21 MED ORDER — GUAIFENESIN-CODEINE 100-10 MG/5ML PO SOLN: 10.0000 mL | Freq: Four times a day (QID) | ORAL | 0 refills | Status: DC | PRN

## 2019-12-21 NOTE — Patient Instructions (Addendum)
-bronchitis is typically viral in nature so no indication for antibiotic at this time.  -cool mist humidifier at night  -honey eaten daily -I like robittusin DM during the day -codeine cough syrup at night ( may make you drowsy. Can take during day if at home as well)  -tessalon pearls as needed for cough.  -albuterol inhaler only AS needed if you feel tight/wheezy, but exam was completely normal   Let me know if you start to have any fevers/change in cough or shortness of breath.   Advil/motrin/aleve prn for rib pain.. you have pleurisy.     Acute Bronchitis, Adult  Acute bronchitis is when air tubes in the lungs (bronchi) suddenly get swollen. The condition can make it hard for you to breathe. In adults, acute bronchitis usually goes away within 2 weeks. A cough caused by bronchitis may last up to 3 weeks. Smoking, allergies, and asthma can make the condition worse. What are the causes? This condition is caused by:  Cold and flu viruses. The most common cause of this condition is the virus that causes the common cold.  Bacteria.  Substances that irritate the lungs, including: ? Smoke from cigarettes and other types of tobacco. ? Dust and pollen. ? Fumes from chemicals, gases, or burned fuel. ? Other materials that pollute indoor or outdoor air.  Close contact with someone who has acute bronchitis. What increases the risk? The following factors may make you more likely to develop this condition:  A weak body's defense system. This is also called the immune system.  Any condition that affects your lungs and breathing, such as asthma. What are the signs or symptoms? Symptoms of this condition include:  A cough.  Coughing up clear, yellow, or green mucus.  Wheezing.  Chest congestion.  Shortness of breath.  A fever.  Body aches.  Chills.  A sore throat. How is this treated? Acute bronchitis may go away over time without treatment. Your doctor may  recommend:  Drinking more fluids.  Taking a medicine for a fever or cough.  Using a device that gets medicine into your lungs (inhaler).  Using a vaporizer or a humidifier. These are machines that add water or moisture in the air to help with coughing and poor breathing. Follow these instructions at home:  Activity  Get a lot of rest.  Avoid places where there are fumes from chemicals.  Return to your normal activities as told by your doctor. Ask your doctor what activities are safe for you. Lifestyle  Drink enough fluids to keep your pee (urine) pale yellow.  Do not drink alcohol.  Do not use any products that contain nicotine or tobacco, such as cigarettes, e-cigarettes, and chewing tobacco. If you need help quitting, ask your doctor. Be aware that: ? Your bronchitis will get worse if you smoke or breathe in other people's smoke (secondhand smoke). ? Your lungs will heal faster if you quit smoking. General instructions  Take over-the-counter and prescription medicines only as told by your doctor.  Use an inhaler, cool mist vaporizer, or humidifier as told by your doctor.  Rinse your mouth often with salt water. To make salt water, dissolve -1 tsp (3-6 g) of salt in 1 cup (237 mL) of warm water.  Keep all follow-up visits as told by your doctor. This is important. How is this prevented? To lower your risk of getting this condition again:  Wash your hands often with soap and water. If soap and water are not  available, use hand sanitizer.  Avoid contact with people who have cold symptoms.  Try not to touch your mouth, nose, or eyes with your hands.  Make sure to get the flu shot every year. Contact a doctor if:  Your symptoms do not get better in 2 weeks.  You vomit more than once or twice.  You have symptoms of loss of fluid from your body (dehydration). These include: ? Dark urine. ? Dry skin or eyes. ? Increased thirst. ? Headaches. ? Confusion. ? Muscle  cramps. Get help right away if:  You cough up blood.  You have chest pain.  You have very bad shortness of breath.  You become dehydrated.  You faint or keep feeling like you are going to faint.  You keep vomiting.  You have a very bad headache.  Your fever or chills get worse. These symptoms may be an emergency. Do not wait to see if the symptoms will go away. Get medical help right away. Call your local emergency services (911 in the [place] Do not drive yourself to the hospital. Summary  Acute bronchitis is when air tubes in the lungs (bronchi) suddenly get swollen. In adults, acute bronchitis usually goes away within 2 weeks.  Take over-the-counter and prescription medicines only as told by your doctor.  Drink enough fluid to keep your pee (urine) pale yellow.  Contact a doctor if your symptoms do not improve after 2 weeks of treatment.  Get help right away if you cough up blood, faint, or have chest pain or shortness of breath. This information is not intended to replace advice given to you by your health care provider. Make sure you discuss any questions you have with your health care provider. Document Revised: 07/25/2018 Document Reviewed: 07/25/2018 Elsevier Patient Education  2020 [employer]   Pleurisy Pleurisy is irritation and swelling (inflammation) of the linings of your lungs (pleura). This can cause pain in your chest, back, or shoulder. It can also cause trouble breathing. Follow these instructions at home: Medicines  Take over-the-counter and prescription medicines only as told by your doctor.  If you were prescribed antibiotic medicine, take it as told by your doctor. Do not stop taking the antibiotic even if you start to feel better. Activity  Rest and return to your normal activities as told by your doctor. Ask your doctor what activities are safe for you.  Do not drive or use heavy machinery while taking prescription pain medicine. General  instructions   Watch for any changes in your condition.  Take deep breaths often, even if it is painful. This can help prevent lung problems.  When lying down, lie on your painful side. This may help you feel less pain.  Do not smoke. If you need help quitting, ask your doctor.  Keep all follow-up visits as told by your doctor. This is important. Contact a doctor if:  You have pain that: ? Gets worse. ? Does not get better with medicine. ? Lasts for more than 1 week.  You have a fever or chills.  You have a cough that does not get better at home.  You have trouble breathing that does not get better at home.  You cough up liquid that looks like pus (purulent secretions). Get help right away if:  Your lips, fingernails, or toenails turn dark or turn blue.  You cough up blood.  You have trouble breathing that gets worse.  You are making loud noises when you breathe (wheezing) and  this gets worse.  You have pain that spreads to your neck, arms, or jaw.  You get a rash.  You throw up (vomit).  You pass out (faint). Summary  Pleurisy is irritation and swelling (inflammation) of the linings of your lungs (pleura).  Pleurisy can cause pain and trouble breathing.  If you have a cough that does not get better at home, contact your doctor.  Get help right away if you are having trouble breathing and it is getting worse. This information is not intended to replace advice given to you by your health care provider. Make sure you discuss any questions you have with your health care provider. Document Revised: 12/14/2016 Document Reviewed: 09/26/2015 Elsevier Patient Education  2020 [employer].
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
## 2019-12-23 NOTE — Progress Notes (Signed)
Moved to in office at different time.  Orland Mustard, MD [place] [facility]

## 2019-12-24 ENCOUNTER — Encounter: Payer: Self-pay | Admitting: Family Medicine

## 2019-12-24 DIAGNOSIS — I83891 Varicose veins of right lower extremities with other complications: Secondary | ICD-10-CM | POA: Insufficient documentation

## 2019-12-25 ENCOUNTER — Encounter: Payer: Self-pay | Admitting: Family Medicine

## 2019-12-25 ENCOUNTER — Telehealth: Payer: Self-pay

## 2019-12-25 NOTE — Telephone Encounter (Signed)
Pt was sent to urgent care due to limited availability in the office today.

## 2019-12-25 NOTE — Telephone Encounter (Signed)
I spoke with pt on the phone he says that his symptoms of abdominal pain, diarrhea, nausea and vomiting started on yesterday. Pt requested an appointment today with Dr. Artis Flock. I recommended that he follow up in Urgent Care due to no availability. Pt voiced understanding.

## 2019-12-28 ENCOUNTER — Telehealth: Payer: No Typology Code available for payment source | Admitting: Family Medicine

## 2019-12-29 NOTE — Progress Notes (Signed)
Not seen. No charge.  Orland Mustard, MD [facility] [facility]

## 2020-01-14 ENCOUNTER — Encounter: Payer: Self-pay | Admitting: Family Medicine

## 2020-02-13 ENCOUNTER — Emergency Department (HOSPITAL_COMMUNITY)
Admission: EM | Admit: 2020-02-13 | Discharge: 2020-02-13 | Disposition: A | Discharge status: Home / Self Care | Payer: BC Managed Care – PPO | Attending: Emergency Medicine | Admitting: Emergency Medicine

## 2020-02-13 ENCOUNTER — Other Ambulatory Visit: Payer: Self-pay

## 2020-02-13 ENCOUNTER — Encounter (HOSPITAL_COMMUNITY): Payer: Self-pay | Admitting: Emergency Medicine

## 2020-02-13 ENCOUNTER — Emergency Department (HOSPITAL_COMMUNITY): Payer: BC Managed Care – PPO

## 2020-02-13 DIAGNOSIS — R202 Paresthesia of skin: Secondary | ICD-10-CM | POA: Diagnosis not present

## 2020-02-13 DIAGNOSIS — R079 Chest pain, unspecified: Secondary | ICD-10-CM | POA: Diagnosis not present

## 2020-02-13 DIAGNOSIS — Z7982 Long term (current) use of aspirin: Secondary | ICD-10-CM | POA: Diagnosis not present

## 2020-02-13 LAB — BASIC METABOLIC PANEL WITH GFR
Anion gap: 9 (ref 5–15)
BUN: 18 mg/dL (ref 6–20)
CO2: 26 mmol/L (ref 22–32)
Calcium: 9.3 mg/dL (ref 8.9–10.3)
Chloride: 104 mmol/L (ref 98–111)
Creatinine, Ser: 1.01 mg/dL (ref 0.61–1.24)
GFR, Estimated: >60 mL/min
Glucose, Bld: 107 mg/dL — ABNORMAL HIGH (ref 70–99)
Potassium: 4.3 mmol/L (ref 3.5–5.1)
Sodium: 139 mmol/L (ref 135–145)

## 2020-02-13 LAB — CBC
HCT: 41.2 % (ref 39.0–52.0)
Hemoglobin: 13.8 g/dL (ref 13.0–17.0)
MCH: 32.2 pg (ref 26.0–34.0)
MCHC: 33.5 g/dL (ref 30.0–36.0)
MCV: 96 fL (ref 80.0–100.0)
Platelets: 247 K/uL (ref 150–400)
RBC: 4.29 MIL/uL (ref 4.22–5.81)
RDW: 11.6 % (ref 11.5–15.5)
WBC: 4.8 K/uL (ref 4.0–10.5)
nRBC: 0 % (ref 0.0–0.2)

## 2020-02-13 LAB — TROPONIN I (HIGH SENSITIVITY)
Troponin I (High Sensitivity): 8 ng/L (ref <18)
Troponin I (High Sensitivity): 10 ng/L (ref <18)

## 2020-02-13 NOTE — ED Triage Notes (Signed)
Patient complains of intermittent stabbing chest pain, states he has been having chest pain x2 days, but initially his pain was in his R chest and radiated down his R arm. Denies pain at this time, denies pain, SOB, N/V/D, endorses sweat.

## 2020-02-13 NOTE — Discharge Instructions (Addendum)
Call your primary care doctor or specialist as discussed in the next 2-3 days.   Return immediately back to the ER if:  Your symptoms worsen within the next 12-24 hours. You develop new symptoms such as new fevers, persistent vomiting, new pain, shortness of breath, or new weakness or numbness, or if you have any other concerns.  
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
## 2020-02-15 ENCOUNTER — Encounter: Payer: Self-pay | Admitting: Family Medicine

## 2020-02-15 ENCOUNTER — Other Ambulatory Visit: Payer: Self-pay

## 2020-02-15 ENCOUNTER — Ambulatory Visit (INDEPENDENT_AMBULATORY_CARE_PROVIDER_SITE_OTHER): Payer: BC Managed Care – PPO | Admitting: Family Medicine

## 2020-02-15 VITALS — BP 138/70 | HR 95 | Temp 98.1°F | Ht 68.0 in | Wt 211.4 lb

## 2020-02-15 DIAGNOSIS — E782 Mixed hyperlipidemia: Secondary | ICD-10-CM

## 2020-02-15 DIAGNOSIS — Z0000 Encounter for general adult medical examination without abnormal findings: Secondary | ICD-10-CM

## 2020-02-15 DIAGNOSIS — E559 Vitamin D deficiency, unspecified: Secondary | ICD-10-CM | POA: Diagnosis not present

## 2020-02-15 DIAGNOSIS — R0789 Other chest pain: Secondary | ICD-10-CM

## 2020-02-15 DIAGNOSIS — Z1159 Encounter for screening for other viral diseases: Secondary | ICD-10-CM | POA: Diagnosis not present

## 2020-02-15 DIAGNOSIS — F411 Generalized anxiety disorder: Secondary | ICD-10-CM | POA: Diagnosis not present

## 2020-02-15 DIAGNOSIS — F1010 Alcohol abuse, uncomplicated: Secondary | ICD-10-CM

## 2020-02-15 LAB — COMPREHENSIVE METABOLIC PANEL
ALT: 105 U/L — ABNORMAL HIGH (ref 0–53)
AST: 58 U/L — ABNORMAL HIGH (ref 0–37)
Albumin: 4.7 g/dL (ref 3.5–5.2)
Alkaline Phosphatase: 81 U/L (ref 39–117)
BUN: 14 mg/dL (ref 6–23)
CO2: 23 mEq/L (ref 19–32)
Calcium: 9.8 mg/dL (ref 8.4–10.5)
Chloride: 104 mEq/L (ref 96–112)
Creatinine, Ser: 0.97 mg/dL (ref 0.40–1.50)
GFR: 95.23 mL/min (ref >60.00)
Glucose, Bld: 111 mg/dL — ABNORMAL HIGH (ref 70–99)
Potassium: 3.4 mEq/L — ABNORMAL LOW (ref 3.5–5.1)
Sodium: 139 mEq/L (ref 135–145)
Total Bilirubin: 0.6 mg/dL (ref 0.2–1.2)
Total Protein: 8 g/dL (ref 6.0–8.3)

## 2020-02-15 LAB — LIPID PANEL
Cholesterol: 209 mg/dL — ABNORMAL HIGH (ref 0–200)
HDL: 61.2 mg/dL (ref >39.00)
NonHDL: 147.41
Total CHOL/HDL Ratio: 3
Triglycerides: 338 mg/dL — ABNORMAL HIGH (ref 0.0–149.0)
VLDL: 67.6 mg/dL — ABNORMAL HIGH (ref 0.0–40.0)

## 2020-02-15 LAB — LDL CHOLESTEROL, DIRECT: Direct LDL: 74 mg/dL

## 2020-02-15 LAB — VITAMIN D 25 HYDROXY (VIT D DEFICIENCY, FRACTURES): VITD: 15.46 ng/mL — ABNORMAL LOW (ref 30.00–100.00)

## 2020-02-15 LAB — TSH: TSH: 1.23 u[IU]/mL (ref 0.35–4.50)

## 2020-02-15 MED ORDER — FLUOXETINE HCL 20 MG PO TABS: 20.0000 mg | ORAL_TABLET | Freq: Every day | ORAL | 1 refills | Status: DC

## 2020-02-15 MED ORDER — HYDROXYZINE HCL 25 MG PO TABS: 25.0000 mg | ORAL_TABLET | Freq: Three times a day (TID) | ORAL | 1 refills | Status: DC | PRN

## 2020-02-15 MED ORDER — NALTREXONE HCL 50 MG PO TABS: 50.0000 mg | ORAL_TABLET | Freq: Every day | ORAL | 0 refills | Status: DC
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
## 2020-02-15 NOTE — Patient Instructions (Addendum)
1) doing a stress test on your heart to make sure it's okay. They will call you with this.  2) for anxiety lets start back prozac 20mg /day. You were on this in the past. We will see you back in one month. Also sent in hydroxyzine prn for panic attacks.  3) for drinking, sent in a drug called naltrexone. You take this daily. We will have you take for a month and see how you are doing.   See you back in a month. Virtual is fine.  Aw   Preventive Care 44-15 Years Old, Male Preventive care refers to lifestyle choices and visits with your health care provider that can promote health and wellness. This includes:  A yearly physical exam. This is also called an annual wellness visit.  Regular dental and eye exams.  Immunizations.  Screening for certain conditions.  Healthy lifestyle choices, such as: ? Eating a healthy diet. ? Getting regular exercise. ? Not using drugs or products that contain nicotine and tobacco. ? Limiting alcohol use. What can I expect for my preventive care visit? Physical exam Your health care provider will check your:  Height and weight. These may be used to calculate your BMI (body mass index). BMI is a measurement that tells if you are at a healthy weight.  Heart rate and blood pressure.  Body temperature.  Skin for abnormal spots. Counseling Your health care provider may ask you questions about your:  Past medical problems.  Family's medical history.  Alcohol, tobacco, and drug use.  Emotional well-being.  Home life and relationship well-being.  Sexual activity.  Diet, exercise, and sleep habits.  Work and work 77.  Access to firearms. What immunizations do I need? Vaccines are usually given at various ages, according to a schedule. Your health care provider will recommend vaccines for you based on your age, medical history, and lifestyle or other factors, such as travel or where you work.   What tests do I need? Blood  tests  Lipid and cholesterol levels. These may be checked every 5 years, or more often if you are over 40 years old.  Hepatitis C test.  Hepatitis B test. Screening  Lung cancer screening. You may have this screening every year starting at age 37 if you have a 30-pack-year history of smoking and currently smoke or have quit within the past 15 years.  Prostate cancer screening. Recommendations will vary depending on your family history and other risks.  Genital exam to check for testicular cancer or hernias.  Colorectal cancer screening. ? All adults should have this screening starting at age 41 and continuing until age 87. ? Your health care provider may recommend screening at age 32 if you are at increased risk. ? You will have tests every 1-10 years, depending on your results and the type of screening test.  Diabetes screening. ? This is done by checking your blood sugar (glucose) after you have not eaten for a while (fasting). ? You may have this done every 1-3 years.  STD (sexually transmitted disease) testing, if you are at risk. Follow these instructions at home: Eating and drinking  Eat a diet that includes fresh fruits and vegetables, whole grains, lean protein, and low-fat dairy products.  Take vitamin and mineral supplements as recommended by your health care provider.  Do not drink alcohol if your health care provider tells you not to drink.  If you drink alcohol: ? Limit how much you have to 0-2 drinks a day. ?  Be aware of how much alcohol is in your drink. In the [place] one drink equals one 12 oz bottle of beer (355 mL), one 5 oz glass of wine (148 mL), or one 1 oz glass of hard liquor (44 mL).   Lifestyle  Take daily care of your teeth and gums. Brush your teeth every morning and night with fluoride toothpaste. Floss one time each day.  Stay active. Exercise for at least 30 minutes 5 or more days each week.  Do not use any products that contain nicotine or  tobacco, such as cigarettes, e-cigarettes, and chewing tobacco. If you need help quitting, ask your health care provider.  Do not use drugs.  If you are sexually active, practice safe sex. Use a condom or other form of protection to prevent STIs (sexually transmitted infections).  If told by your health care provider, take low-dose aspirin daily starting at age 64.  Find healthy ways to cope with stress, such as: ? Meditation, yoga, or listening to music. ? Journaling. ? Talking to a trusted person. ? Spending time with friends and family. Safety  Always wear your seat belt while driving or riding in a vehicle.  Do not drive: ? If you have been drinking alcohol. Do not ride with someone who has been drinking. ? When you are tired or distracted. ? While texting.  Wear a helmet and other protective equipment during sports activities.  If you have firearms in your house, make sure you follow all gun safety procedures. What's next?  Go to your health care provider once a year for an annual wellness visit.  Ask your health care provider how often you should have your eyes and teeth checked.  Stay up to date on all vaccines. This information is not intended to replace advice given to you by your health care provider. Make sure you discuss any questions you have with your health care provider. Document Revised: 09/30/2018 Document Reviewed: 12/26/2017 Elsevier Patient Education  2021 [employer].

## 2020-02-16 LAB — HEPATITIS C ANTIBODY
Hepatitis C Ab: NONREACTIVE
SIGNAL TO CUT-OFF: 0.03 (ref <1.00)

## 2020-02-17 ENCOUNTER — Other Ambulatory Visit: Payer: Self-pay | Admitting: Family Medicine

## 2020-02-17 MED ORDER — VITAMIN D (ERGOCALCIFEROL) 1.25 MG (50000 UNIT) PO CAPS: ORAL_CAPSULE | ORAL | 0 refills | Status: DC

## 2020-03-01 ENCOUNTER — Encounter: Payer: Self-pay | Admitting: Internal Medicine

## 2020-03-01 ENCOUNTER — Ambulatory Visit: Payer: BC Managed Care – PPO | Admitting: Internal Medicine

## 2020-03-01 ENCOUNTER — Other Ambulatory Visit: Payer: Self-pay

## 2020-03-01 VITALS — BP 118/86 | HR 87 | Ht 66.0 in | Wt 215.6 lb

## 2020-03-01 DIAGNOSIS — E782 Mixed hyperlipidemia: Secondary | ICD-10-CM | POA: Diagnosis not present

## 2020-03-01 DIAGNOSIS — R079 Chest pain, unspecified: Secondary | ICD-10-CM | POA: Diagnosis not present

## 2020-03-01 NOTE — Patient Instructions (Signed)
Medication Instructions:  Your physician recommends that you continue on your current medications as directed. Please refer to the Current Medication list given to you today.  *If you need a refill on your cardiac medications before your next appointment, please call your pharmacy*   Lab Work: 6 MONTHS: Fasting lipid panel If you have labs (blood work) drawn today and your tests are completely normal, you will receive your results only by: Marland Kitchen MyChart Message (if you have MyChart) OR . A paper copy in the mail If you have any lab test that is abnormal or we need to change your treatment, we will call you to review the results.   Testing/Procedures: Your physician has requested that you have an exercise tolerance test. Please also follow instruction sheet, as given.        COVID TEST--___________-- Bonita Quin will go to [address]. [place], [place] [address]  for your Covid testing.   This is a drive thru test site.   Be sure to share with the first checkpoint that you are there for pre-procedure/surgery testing. Stay in your car and the nurse team will come to your car to test you.  After you are tested please go home and self-quarantine until the day of your procedure.         Follow-Up: At [facility], you and your health needs are our priority.  As part of our continuing mission to provide you with exceptional heart care, we have created designated Provider Care Teams.  These Care Teams include your primary Cardiologist (physician) and Advanced Practice Providers (APPs -  Physician Assistants and Nurse Practitioners) who all work together to provide you with the care you need, when you need it.  We recommend signing up for the patient portal called "MyChart".  Sign up information is provided on this After Visit Summary.  MyChart is used to connect with patients for Virtual Visits (Telemedicine).  Patients are able to view lab/test results, encounter notes, upcoming appointments, etc.   Non-urgent messages can be sent to your provider as well.   To learn more about what you can do with MyChart, go to [email or web address].    Your next appointment:   6 month(s)  The format for your next appointment:   In Person  Provider:   You may see Izora Ribas, MD or one of the following Advanced Practice Providers on your designated Care Team:    Ronie Spies, PA-C  Jacolyn Reedy, PA-C
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
## 2020-03-01 NOTE — Addendum Note (Signed)
Addended by: Riley Lam A on: 03/01/2020 10:12 AM   Modules accepted: Orders

## 2020-03-25 ENCOUNTER — Other Ambulatory Visit (HOSPITAL_COMMUNITY): Payer: BC Managed Care – PPO

## 2020-03-28 ENCOUNTER — Other Ambulatory Visit (HOSPITAL_COMMUNITY)
Admission: RE | Admit: 2020-03-28 | Discharge: 2020-03-28 | Disposition: A | Discharge status: Home / Self Care | Payer: BC Managed Care – PPO | Source: Ambulatory Visit | Attending: Internal Medicine | Admitting: Internal Medicine

## 2020-03-28 DIAGNOSIS — Z01812 Encounter for preprocedural laboratory examination: Secondary | ICD-10-CM | POA: Diagnosis not present

## 2020-03-28 DIAGNOSIS — Z20822 Contact with and (suspected) exposure to covid-19: Secondary | ICD-10-CM | POA: Insufficient documentation

## 2020-03-29 ENCOUNTER — Ambulatory Visit (INDEPENDENT_AMBULATORY_CARE_PROVIDER_SITE_OTHER): Payer: BC Managed Care – PPO

## 2020-03-29 ENCOUNTER — Other Ambulatory Visit: Payer: Self-pay

## 2020-03-29 DIAGNOSIS — R079 Chest pain, unspecified: Secondary | ICD-10-CM

## 2020-03-29 DIAGNOSIS — E782 Mixed hyperlipidemia: Secondary | ICD-10-CM | POA: Diagnosis not present

## 2020-03-29 LAB — EXERCISE TOLERANCE TEST
Estimated workload: 10.1 METS
Exercise duration (min): 9 min
Exercise duration (sec): 0 s
MPHR: 176 {beats}/min
Peak HR: 166 {beats}/min
Percent HR: 94 %
RPE: 17
Rest HR: 93 {beats}/min

## 2020-03-29 LAB — SARS CORONAVIRUS 2 (TAT 6-24 HRS): SARS Coronavirus 2: NEGATIVE

## 2020-04-26 ENCOUNTER — Encounter: Payer: Self-pay | Admitting: Family

## 2020-04-26 ENCOUNTER — Telehealth (INDEPENDENT_AMBULATORY_CARE_PROVIDER_SITE_OTHER): Payer: BC Managed Care – PPO | Admitting: Family

## 2020-04-26 DIAGNOSIS — R059 Cough, unspecified: Secondary | ICD-10-CM

## 2020-04-26 DIAGNOSIS — J302 Other seasonal allergic rhinitis: Secondary | ICD-10-CM

## 2020-04-26 MED ORDER — PREDNISONE 20 MG PO TABS: 40.0000 mg | ORAL_TABLET | Freq: Every day | ORAL | 0 refills | Status: DC

## 2020-04-26 MED ORDER — CETIRIZINE HCL 10 MG PO TABS: 10.0000 mg | ORAL_TABLET | Freq: Every day | ORAL | 5 refills | Status: DC

## 2020-04-26 NOTE — Progress Notes (Signed)
Virtual Visit via Video   I connected with patient on 04/26/20 at  4:20 PM EDT by a video enabled telemedicine application and verified that I am speaking with the correct person using two identifiers.  Location patient: Home Location provider: [facility], Office Persons participating in the virtual visit: Worthy Rancher, Carole Binning  I discussed the limitations of evaluation and management by telemedicine and the availability of in person appointments. The patient expressed understanding and agreed to proceed.  Subjective:   HPI:   45 year old African-American male is seen by video visit today with concerns of cough, nasal congestion, scratchy throat x3 days.  He has been taking over-the-counter medication without much relief.  Denies any known sick contacts.  Did take a COVID-19 rapid antigen test that was negative.  ROS:   See pertinent positives and negatives per HPI.  Patient Active Problem List   Diagnosis Date Noted  . Chest pain of uncertain etiology 03/01/2020  . GAD (generalized anxiety disorder) 02/15/2020  . Varicose veins of leg with swelling, right 12/24/2019  . Bilateral carpal tunnel syndrome 11/20/2018  . Vitamin D deficiency 06/26/2017  . Mixed hyperlipidemia 06/24/2017    Social History   Tobacco Use  . Smoking status: Never Smoker  . Smokeless tobacco: Never Used  Substance Use Topics  . Alcohol use: Yes    Alcohol/week: 12.0 standard drinks    Types: 12 Shots of liquor per week    Comment: alcohol and substance abuse by self    Current Outpatient Medications:  .  albuterol (VENTOLIN HFA) 108 (90 Base) MCG/ACT inhaler, Inhale 1-2 puffs into the lungs every 6 (six) hours as needed for wheezing or shortness of breath., Disp: 1 each, Rfl: 0 .  aspirin EC 81 MG tablet, Take 81 mg by mouth 2 (two) times daily., Disp: , Rfl:  .  baclofen (LIORESAL) 10 MG tablet, Take 1 tablet (10 mg total) by mouth 3 (three) times daily., Disp: 45 each, Rfl:  0 .  benzonatate (TESSALON) 200 MG capsule, Take 1 capsule (200 mg total) by mouth 2 (two) times daily as needed for cough., Disp: 30 capsule, Rfl: 0 .  cetirizine (ZYRTEC) 10 MG tablet, Take 1 tablet (10 mg total) by mouth daily., Disp: 30 tablet, Rfl: 5 .  EPINEPHrine 0.3 mg/0.3 mL IJ SOAJ injection, Inject 0.3 mg into the muscle once as needed. Allergic reaction, Disp: , Rfl:  .  FLUoxetine (PROZAC) 20 MG tablet, Take 1 tablet (20 mg total) by mouth daily., Disp: 90 tablet, Rfl: 1 .  hydrOXYzine (ATARAX/VISTARIL) 25 MG tablet, Take 1 tablet (25 mg total) by mouth 3 (three) times daily as needed for anxiety., Disp: 30 tablet, Rfl: 1 .  multivitamin (ONE-A-DAY MEN'S) TABS tablet, Take 1 tablet by mouth daily., Disp: , Rfl:  .  naltrexone (DEPADE) 50 MG tablet, Take 1 tablet (50 mg total) by mouth daily., Disp: 30 tablet, Rfl: 0 .  predniSONE (DELTASONE) 20 MG tablet, Take 2 tablets (40 mg total) by mouth daily with breakfast., Disp: 10 tablet, Rfl: 0 .  Vitamin D, Ergocalciferol, (DRISDOL) 1.25 MG (50000 UNIT) CAPS capsule, One capsule by mouth once a week for 12 weeks. Then take 2000IU/day, Disp: 12 capsule, Rfl: 0  Allergies  Allergen Reactions  . Shellfish Allergy Anaphylaxis  . Lactose Diarrhea  . Tamiflu [Oseltamivir Phosphate] Swelling    Tongue swelling    Objective:   There were no vitals taken for this visit.  Patient is well-developed, well-nourished in  no acute distress.  Resting comfortably at home.  Head is normocephalic, atraumatic.  No labored breathing.  Speech is clear and coherent with logical content.  Patient is alert and oriented at baseline.    Assessment and Plan:    Alessander was seen today for cough.  Diagnoses and all orders for this visit:  Cough  Seasonal allergies  Other orders -     predniSONE (DELTASONE) 20 MG tablet; Take 2 tablets (40 mg total) by mouth daily with breakfast. -     cetirizine (ZYRTEC) 10 MG tablet; Take 1 tablet (10 mg total)  by mouth daily.    Advised patient to call the office with any questions or concerns.  Zyrtec 10 mg once a day post prednisone completion.  Coolmist humidifier as needed.  Follow-up with PCP as scheduled. Eulis Foster, FNP 04/26/2020

## 2020-04-26 NOTE — Patient Instructions (Signed)

## 2020-05-03 ENCOUNTER — Ambulatory Visit: Payer: BC Managed Care – PPO | Admitting: Family

## 2020-05-03 ENCOUNTER — Encounter: Payer: Self-pay | Admitting: Family

## 2020-05-03 ENCOUNTER — Ambulatory Visit (INDEPENDENT_AMBULATORY_CARE_PROVIDER_SITE_OTHER): Payer: BC Managed Care – PPO

## 2020-05-03 ENCOUNTER — Other Ambulatory Visit: Payer: Self-pay

## 2020-05-03 VITALS — BP 151/95 | HR 88 | Temp 97.4°F | Ht 66.0 in | Wt 217.2 lb

## 2020-05-03 DIAGNOSIS — J219 Acute bronchiolitis, unspecified: Secondary | ICD-10-CM | POA: Diagnosis not present

## 2020-05-03 DIAGNOSIS — R059 Cough, unspecified: Secondary | ICD-10-CM

## 2020-05-03 DIAGNOSIS — J984 Other disorders of lung: Secondary | ICD-10-CM | POA: Diagnosis not present

## 2020-05-03 MED ORDER — EPINEPHRINE 0.3 MG/0.3ML IJ SOAJ: 0.3000 mg | Freq: Once | INTRAMUSCULAR | 0 refills | Status: AC | PRN

## 2020-05-03 MED ORDER — DOXYCYCLINE HYCLATE 100 MG PO TABS: 100.0000 mg | ORAL_TABLET | Freq: Two times a day (BID) | ORAL | 0 refills | Status: DC

## 2020-05-03 MED ORDER — PREDNISONE 20 MG PO TABS
20.0000 mg | ORAL_TABLET | Freq: Every day | ORAL | 0 refills | Status: DC
Start: 2020-05-03 — End: 2020-06-17

## 2020-05-03 NOTE — Patient Instructions (Signed)
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
## 2020-05-25 ENCOUNTER — Encounter: Payer: Self-pay | Admitting: Family Medicine

## 2020-05-25 ENCOUNTER — Other Ambulatory Visit: Payer: BC Managed Care – PPO

## 2020-05-25 ENCOUNTER — Ambulatory Visit: Payer: BC Managed Care – PPO | Admitting: Family Medicine

## 2020-05-25 ENCOUNTER — Ambulatory Visit (INDEPENDENT_AMBULATORY_CARE_PROVIDER_SITE_OTHER): Payer: BC Managed Care – PPO

## 2020-05-25 ENCOUNTER — Other Ambulatory Visit: Payer: Self-pay

## 2020-05-25 VITALS — Ht 66.0 in

## 2020-05-25 DIAGNOSIS — J181 Lobar pneumonia, unspecified organism: Secondary | ICD-10-CM | POA: Diagnosis not present

## 2020-05-25 DIAGNOSIS — R918 Other nonspecific abnormal finding of lung field: Secondary | ICD-10-CM | POA: Diagnosis not present
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
## 2020-05-26 NOTE — Progress Notes (Signed)
Patient did not need OV> repeat cxr only.  Orland Mustard, MD [place] [facility]

## 2020-05-27 ENCOUNTER — Encounter: Payer: Self-pay | Admitting: Family Medicine

## 2020-05-27 ENCOUNTER — Other Ambulatory Visit: Payer: Self-pay | Admitting: Family Medicine

## 2020-05-27 DIAGNOSIS — R9389 Abnormal findings on diagnostic imaging of other specified body structures: Secondary | ICD-10-CM

## 2020-05-27 MED ORDER — AMOXICILLIN-POT CLAVULANATE 875-125 MG PO TABS: 1.0000 | ORAL_TABLET | Freq: Two times a day (BID) | ORAL | 0 refills | Status: DC

## 2020-05-28 ENCOUNTER — Encounter: Payer: Self-pay | Admitting: Family Medicine

## 2020-05-30 NOTE — Telephone Encounter (Signed)
Called pt to explain results. He voiced understanding.

## 2020-05-30 NOTE — Telephone Encounter (Signed)
I touched basis with the pt to address his concerns. I was able to explain the chest xray result note given by Dr. Artis Flock so that he can now understand instructions. He voiced understanding, and will take further medications and schedule with Pulmonology.

## 2020-06-07 ENCOUNTER — Telehealth: Payer: Self-pay

## 2020-06-07 NOTE — Telephone Encounter (Signed)
Error

## 2020-06-15 ENCOUNTER — Telehealth: Payer: Self-pay

## 2020-06-15 NOTE — Telephone Encounter (Signed)
MEDICATION: amoxicillin-clavulanate (AUGMENTIN) 875-125 MG tablet  PHARMACY:  [id number] - [place], [place] - [address] AT [facility] OF [address] Phone:  [phone]  Fax:  [phone]        Comments: Patient stated he is not getting any better and has appt with Dr. Mardelle Matte on Friday.   **Let patient know to contact pharmacy at the end of the day to make sure medication is ready. **  ** Please notify patient to allow 48-72 hours to process**  **Encourage patient to contact the pharmacy for refills or they can request refills through [facility]**

## 2020-06-15 NOTE — Telephone Encounter (Signed)
Refill can be addressed at the time of appointment.

## 2020-06-17 ENCOUNTER — Encounter: Payer: Self-pay | Admitting: Family Medicine

## 2020-06-17 ENCOUNTER — Ambulatory Visit: Payer: BC Managed Care – PPO | Admitting: Family Medicine

## 2020-06-17 ENCOUNTER — Other Ambulatory Visit: Payer: Self-pay

## 2020-06-17 VITALS — BP 132/80 | HR 70 | Temp 98.0°F | Ht 66.0 in | Wt 219.6 lb

## 2020-06-17 DIAGNOSIS — R053 Chronic cough: Secondary | ICD-10-CM | POA: Diagnosis not present

## 2020-06-17 DIAGNOSIS — R9389 Abnormal findings on diagnostic imaging of other specified body structures: Secondary | ICD-10-CM

## 2020-06-17 MED ORDER — FLOVENT HFA 110 MCG/ACT IN AERO: 2.0000 | INHALATION_SPRAY | Freq: Two times a day (BID) | RESPIRATORY_TRACT | 0 refills | Status: DC

## 2020-06-17 NOTE — Progress Notes (Signed)
Subjective  CC:  Chief Complaint  Patient presents with  . Cough    On going dry cough, ran out of medication given for cough.    Same day acute visit; PCP not available. New pt to me. Chart reviewed.   HPI: Collin Kaiser is a 45 y.o. male who presents to the office today to address the problems listed above in the chief complaint.  45 year old male with 39-month history of cough.  I reviewed old records.  He was seen virtually for cough and was treated with prednisone and Zyrtec.  A week later he was seen in the office for persistent cough.  He believes the prednisone helped initially.  Chest x-ray was obtained and showed bronchitic changes without clear infiltrate.  Prednisone taper was extended and doxycycline was added.  Symptoms improved but short-term only.  He returns because he has spasms of cough and he is concerned about his abnormal chest x-ray.  6-week follow-up chest x-ray showed worsening changes possibly consistent with an interstitial lung disease.  Patient denies fevers, chills but will have occasional productive cough.  It is mostly dry however.  He denies shortness of breath or chest pain.  He has no lower extremity swelling or calf pain.  No pleuritic chest pain.  He otherwise feels well.  No myalgias or fatigue.  His PCP referred him to pulmonology and he will be seen there in 2 weeks.  He is here today because he wants to find out what is wrong with him.  He has no history of lung disease.  He is a non-smoker.  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  He reports these coughing spells will happen at home and at work.  No known triggers.  He denies palpitations.   Assessment  1. Abnormal chest x-ray   2. Persistent cough      Plan   Cough with abnormal chest x-ray: Progressive symptoms, no clear sign of infection at this time.  Check lab work.  Refer to pulmonology.  He clinically is clinically stable.  He does not have symptoms of pulmonary embolism.  He  will need further imaging and PCP has deferred this to pulmonology.  We will add albuterol and Flovent inhaler to see if this helps in the short-term.  This visit lasted 32 minutes and included chart review, physical examination and a long discussion regarding his possible etiologies and course of further evaluation.  Patient is instructed to go to the emergency room if he has any shortness of breath or chest pain.  Follow up: With pulmonology as scheduled Visit date not found  Orders Placed This Encounter  Procedures  . Antinuclear Antib (ANA)  . CBC with Differential/Platelet  . Comprehensive metabolic panel  . Rheumatoid Factor  . Sedimentation rate   Meds ordered this encounter  Medications  . fluticasone (FLOVENT HFA) 110 MCG/ACT inhaler    Sig: Inhale 2 puffs into the lungs in the morning and at bedtime.    Dispense:  1 each    Refill:  0      I reviewed the patients updated PMH, FH, and SocHx.    Patient Active Problem List   Diagnosis Date Noted  . Chest pain of uncertain etiology 03/01/2020  . GAD (generalized anxiety disorder) 02/15/2020  . Varicose veins of leg with swelling, right 12/24/2019  . Bilateral carpal tunnel syndrome 11/20/2018  . Vitamin D deficiency 06/26/2017  . Mixed hyperlipidemia 06/24/2017   Current Meds  Medication  Sig  . albuterol (VENTOLIN HFA) 108 (90 Base) MCG/ACT inhaler Inhale 1-2 puffs into the lungs every 6 (six) hours as needed for wheezing or shortness of breath.  . cetirizine (ZYRTEC) 10 MG tablet Take 1 tablet (10 mg total) by mouth daily.  Marland Kitchen EPINEPHrine 0.3 mg/0.3 mL IJ SOAJ injection Inject 0.3 mg into the muscle once as needed. Allergic reaction  . FLUoxetine (PROZAC) 20 MG tablet Take 1 tablet (20 mg total) by mouth daily.  . fluticasone (FLOVENT HFA) 110 MCG/ACT inhaler Inhale 2 puffs into the lungs in the morning and at bedtime.  . hydrOXYzine (ATARAX/VISTARIL) 25 MG tablet Take 1 tablet (25 mg total) by mouth 3 (three) times  daily as needed for anxiety.  . multivitamin (ONE-A-DAY MEN'S) TABS tablet Take 1 tablet by mouth daily.  . naltrexone (DEPADE) 50 MG tablet Take 1 tablet (50 mg total) by mouth daily.  . [DISCONTINUED] amoxicillin-clavulanate (AUGMENTIN) 875-125 MG tablet Take 1 tablet by mouth 2 (two) times daily.    Allergies: Patient is allergic to shellfish allergy, lactose, and tamiflu [oseltamivir phosphate]. Family History: Patient family history includes Alcohol abuse in his father; Arthritis in his mother; Diabetes in his father; Early death in his father; Hyperlipidemia in his father and mother; Hypertension in his father and mother; Stroke in his father. Social History:  Patient  reports that he has never smoked. He has never used smokeless tobacco. He reports current alcohol use of about 12.0 standard drinks of alcohol per week. He reports that he does not use drugs.  Review of Systems: Constitutional: Negative for fever malaise or anorexia Cardiovascular: negative for chest pain Respiratory: negative for SOB or persistent cough Gastrointestinal: negative for abdominal pain  Objective  Vitals: BP 132/80   Pulse 70   Temp 98 F (36.7 C) (Temporal)   Ht 5\' 6"  (1.676 m)   Wt 219 lb 9.6 oz (99.6 kg)   SpO2 96%   BMI 35.44 kg/m  General: no acute distress , A&Ox3, no respiratory distress, speaks clearly. HEENT: PEERL, conjunctiva normal, neck is supple Cardiovascular:  RRR without murmur or gallop.  Respiratory:  Good breath sounds bilaterally, CTAB with normal respiratory effort, no wheezing or rales Skin:  Warm, no rashes Extremities: No edema or cords     Commons side effects, risks, benefits, and alternatives for medications and treatment plan prescribed today were discussed, and the patient expressed understanding of the given instructions. Patient is instructed to call or message via MyChart if he/she has any questions or concerns regarding our treatment plan. No barriers to  understanding were identified. We discussed Red Flag symptoms and signs in detail. Patient expressed understanding regarding what to do in case of urgent or emergency type symptoms.   Medication list was reconciled, printed and provided to the patient in AVS. Patient instructions and summary information was reviewed with the patient as documented in the AVS. This note was prepared with assistance of Dragon voice recognition software. Occasional wrong-word or sound-a-like substitutions may have occurred due to the inherent limitations of voice recognition software  This visit occurred during the SARS-CoV-2 public health emergency.  Safety protocols were in place, including screening questions prior to the visit, additional usage of staff PPE, and extensive cleaning of exam room while observing appropriate contact time as indicated for disinfecting solutions.

## 2020-06-17 NOTE — Patient Instructions (Signed)
Please return for a transfer of care visit with me if you would like.   Please see the pulmonologist as scheduled for further evaluation of you lungs.  In the meantime, use the albuterol inhaler as needed for coughing to see if it is helpful. Start the flovent inhaler twice a day.   If you have any questions or concerns, please don't hesitate to send me a message via MyChart or call the office at [phone]. Thank you for visiting with Collin Kaiser today! It's our pleasure caring for you.

## 2020-06-20 LAB — COMPREHENSIVE METABOLIC PANEL
AG Ratio: 1.4 (calc) (ref 1.0–2.5)
ALT: 32 U/L (ref 9–46)
AST: 22 U/L (ref 10–40)
Albumin: 4.6 g/dL (ref 3.6–5.1)
Alkaline phosphatase (APISO): 78 U/L (ref 36–130)
BUN: 17 mg/dL (ref 7–25)
CO2: 25 mmol/L (ref 20–32)
Calcium: 9.8 mg/dL (ref 8.6–10.3)
Chloride: 100 mmol/L (ref 98–110)
Creat: 1.08 mg/dL (ref 0.60–1.35)
Globulin: 3.2 g/dL (calc) (ref 1.9–3.7)
Glucose, Bld: 100 mg/dL — ABNORMAL HIGH (ref 65–99)
Potassium: 4.2 mmol/L (ref 3.5–5.3)
Sodium: 136 mmol/L (ref 135–146)
Total Bilirubin: 0.8 mg/dL (ref 0.2–1.2)
Total Protein: 7.8 g/dL (ref 6.1–8.1)

## 2020-06-20 LAB — CBC WITH DIFFERENTIAL/PLATELET
Absolute Monocytes: 713 cells/uL (ref 200–950)
Basophils Absolute: 62 cells/uL (ref 0–200)
Basophils Relative: 1 %
Eosinophils Absolute: 756 cells/uL — ABNORMAL HIGH (ref 15–500)
Eosinophils Relative: 12.2 %
HCT: 41.6 % (ref 38.5–50.0)
Hemoglobin: 14 g/dL (ref 13.2–17.1)
Lymphs Abs: 2412 cells/uL (ref 850–3900)
MCH: 30.8 pg (ref 27.0–33.0)
MCHC: 33.7 g/dL (ref 32.0–36.0)
MCV: 91.4 fL (ref 80.0–100.0)
MPV: 10.3 fL (ref 7.5–12.5)
Monocytes Relative: 11.5 %
Neutro Abs: 2257 cells/uL (ref 1500–7800)
Neutrophils Relative %: 36.4 %
Platelets: 305 10*3/uL (ref 140–400)
RBC: 4.55 10*6/uL (ref 4.20–5.80)
RDW: 11.3 % (ref 11.0–15.0)
Total Lymphocyte: 38.9 %
WBC: 6.2 10*3/uL (ref 3.8–10.8)

## 2020-06-20 LAB — ANA: Anti Nuclear Antibody (ANA): NEGATIVE

## 2020-06-20 LAB — SEDIMENTATION RATE: Sed Rate: 11 mm/h (ref 0–15)

## 2020-06-20 LAB — RHEUMATOID FACTOR: Rheumatoid fact SerPl-aCnc: <14 IU/mL (ref <14)

## 2020-06-30 ENCOUNTER — Other Ambulatory Visit: Payer: Self-pay

## 2020-06-30 ENCOUNTER — Encounter: Payer: Self-pay | Admitting: Emergency Medicine

## 2020-06-30 ENCOUNTER — Ambulatory Visit (INDEPENDENT_AMBULATORY_CARE_PROVIDER_SITE_OTHER): Payer: BC Managed Care – PPO | Admitting: Emergency Medicine

## 2020-06-30 VITALS — BP 122/82 | HR 89 | Temp 98.6°F | Ht 64.0 in | Wt 225.0 lb

## 2020-06-30 DIAGNOSIS — R9389 Abnormal findings on diagnostic imaging of other specified body structures: Secondary | ICD-10-CM | POA: Diagnosis not present

## 2020-06-30 DIAGNOSIS — R053 Chronic cough: Secondary | ICD-10-CM | POA: Diagnosis not present

## 2020-06-30 NOTE — Patient Instructions (Addendum)
We will obtain a high-resolution CT scan of the chest Hold off on starting Flovent inhaler for now Okay to keep your albuterol inhaler available to use 2 puffs if needed for shortness of breath, coughing, chest tightness Continue Zyrtec as you have been taking it Follow Dr. Delton Coombes next available after your CT scan so that we can review the results together.

## 2020-06-30 NOTE — Assessment & Plan Note (Signed)
Somewhat improved.  Question whether this was a viral process or whether it may related to increased allergy burden.  He got better with prednisone.  I think we can defer the inhaled corticosteroid, continue Zyrtec.  Keep albuterol available in case he needs it.  Depending on persistence, CT results may need to obtain pulmonary function testing.
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
## 2020-06-30 NOTE — Assessment & Plan Note (Signed)
With interstitial prominence, question reticulonodular changes.  Could have been an acute inflammatory process as it was present when he was having cough, viral prodrome.  His COVID testing was negative.  Consider atypical infection, consider noninfectious autoimmune process or pneumonitis.  There was no resolution on a 1 month follow-up chest x-ray.  I believe high-resolution CT chest will help [place] better characterize his parenchyma.  Depending on results we will decide whether he needs other evaluation, bronchoscopy for biopsy, culture.

## 2020-07-06 ENCOUNTER — Other Ambulatory Visit: Payer: Self-pay

## 2020-07-06 ENCOUNTER — Ambulatory Visit (INDEPENDENT_AMBULATORY_CARE_PROVIDER_SITE_OTHER)
Admission: RE | Admit: 2020-07-06 | Discharge: 2020-07-06 | Disposition: A | Discharge status: Home / Self Care | Payer: BC Managed Care – PPO | Source: Ambulatory Visit | Attending: Emergency Medicine | Admitting: Emergency Medicine

## 2020-07-06 DIAGNOSIS — R0602 Shortness of breath: Secondary | ICD-10-CM | POA: Diagnosis not present

## 2020-07-06 DIAGNOSIS — R9389 Abnormal findings on diagnostic imaging of other specified body structures: Secondary | ICD-10-CM | POA: Diagnosis not present

## 2020-07-16 ENCOUNTER — Other Ambulatory Visit: Payer: Self-pay | Admitting: Family Medicine

## 2020-08-17 ENCOUNTER — Other Ambulatory Visit: Payer: BC Managed Care – PPO

## 2020-08-17 ENCOUNTER — Other Ambulatory Visit: Payer: Self-pay

## 2020-08-17 ENCOUNTER — Other Ambulatory Visit: Payer: BC Managed Care – PPO | Admitting: *Deleted

## 2020-08-17 DIAGNOSIS — E782 Mixed hyperlipidemia: Secondary | ICD-10-CM | POA: Diagnosis not present

## 2020-08-18 LAB — LIPID PANEL
Chol/HDL Ratio: 3.3 ratio (ref 0.0–5.0)
Cholesterol, Total: 235 mg/dL — ABNORMAL HIGH (ref 100–199)
HDL: 71 mg/dL (ref >39)
LDL Chol Calc (NIH): 136 mg/dL — ABNORMAL HIGH (ref 0–99)
Triglycerides: 159 mg/dL — ABNORMAL HIGH (ref 0–149)
VLDL Cholesterol Cal: 28 mg/dL (ref 5–40)

## 2020-09-13 LAB — LIPID PANEL
Cholesterol: 252 — AB (ref 0–200)
HDL: 65 (ref 35–70)
LDL Cholesterol: 135
LDl/HDL Ratio: 3.9
Triglycerides: 367 — AB (ref 40–160)

## 2020-09-13 LAB — BASIC METABOLIC PANEL: Glucose: 100

## 2020-09-13 LAB — HEMOGLOBIN A1C: Hemoglobin A1C: 5.2

## 2020-09-16 ENCOUNTER — Encounter: Payer: Self-pay | Admitting: Internal Medicine

## 2020-09-16 ENCOUNTER — Ambulatory Visit (INDEPENDENT_AMBULATORY_CARE_PROVIDER_SITE_OTHER): Payer: BC Managed Care – PPO | Admitting: Internal Medicine

## 2020-09-16 ENCOUNTER — Other Ambulatory Visit: Payer: Self-pay

## 2020-09-16 VITALS — BP 124/78 | HR 80 | Ht 64.0 in | Wt 222.0 lb

## 2020-09-16 DIAGNOSIS — E782 Mixed hyperlipidemia: Secondary | ICD-10-CM
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
## 2020-09-16 NOTE — Patient Instructions (Addendum)
Medication Instructions:  Your physician recommends that you continue on your current medications as directed. Please refer to the Current Medication list given to you today.  *If you need a refill on your cardiac medications before your next appointment, please call your pharmacy*   Lab Work: IN 3 MONTHS: Fasting lipid panel (nothing to eat or drink except water 8 hours prior) If you have labs (blood work) drawn today and your tests are completely normal, you will receive your results only by: MyChart Message (if you have MyChart) OR A paper copy in the mail If you have any lab test that is abnormal or we need to change your treatment, we will call you to review the results.   Testing/Procedures: NONE   Follow-Up:  AS NEEDED At [facility], you and your health needs are our priority.  As part of our continuing mission to provide you with exceptional heart care, we have created designated Provider Care Teams.  These Care Teams include your primary Cardiologist (physician) and Advanced Practice Providers (APPs -  Physician Assistants and Nurse Practitioners) who all work together to provide you with the care you need, when you need it.

## 2020-09-26 ENCOUNTER — Encounter: Payer: BC Managed Care – PPO | Admitting: Family Medicine

## 2020-11-17 ENCOUNTER — Encounter: Payer: Self-pay | Admitting: Family Medicine

## 2020-11-17 ENCOUNTER — Ambulatory Visit (INDEPENDENT_AMBULATORY_CARE_PROVIDER_SITE_OTHER): Payer: BC Managed Care – PPO | Admitting: Family Medicine

## 2020-11-17 ENCOUNTER — Other Ambulatory Visit: Payer: Self-pay

## 2020-11-17 VITALS — BP 126/86 | HR 88 | Temp 97.9°F | Ht 64.0 in | Wt 214.4 lb

## 2020-11-17 DIAGNOSIS — E782 Mixed hyperlipidemia: Secondary | ICD-10-CM

## 2020-11-17 DIAGNOSIS — R030 Elevated blood-pressure reading, without diagnosis of hypertension: Secondary | ICD-10-CM | POA: Diagnosis not present

## 2020-11-17 DIAGNOSIS — F411 Generalized anxiety disorder: Secondary | ICD-10-CM

## 2020-11-17 DIAGNOSIS — F1090 Alcohol use, unspecified, uncomplicated: Secondary | ICD-10-CM | POA: Diagnosis not present

## 2020-11-17 DIAGNOSIS — F1911 Other psychoactive substance abuse, in remission: Secondary | ICD-10-CM

## 2020-11-17 DIAGNOSIS — R053 Chronic cough: Secondary | ICD-10-CM
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
## 2020-11-17 NOTE — Patient Instructions (Signed)
Please return in 3 months for your annual complete physical; please come fasting.   Keep working on healthy lifestyle! Eat healthy, drink rarely, and exercise.  If you have any questions or concerns, please don't hesitate to send me a message via MyChart or call the office at [phone]. Thank you for visiting with [place] today! It's our pleasure caring for you.

## 2020-12-10 ENCOUNTER — Encounter (HOSPITAL_COMMUNITY): Payer: Self-pay

## 2020-12-10 ENCOUNTER — Other Ambulatory Visit: Payer: Self-pay

## 2020-12-10 ENCOUNTER — Ambulatory Visit (HOSPITAL_COMMUNITY)
Admission: EM | Admit: 2020-12-10 | Discharge: 2020-12-10 | Disposition: A | Discharge status: Home / Self Care | Payer: BC Managed Care – PPO | Attending: Urgent Care | Admitting: Urgent Care

## 2020-12-10 DIAGNOSIS — R519 Headache, unspecified: Secondary | ICD-10-CM

## 2020-12-10 DIAGNOSIS — R52 Pain, unspecified: Secondary | ICD-10-CM

## 2020-12-10 DIAGNOSIS — J069 Acute upper respiratory infection, unspecified: Secondary | ICD-10-CM

## 2020-12-10 LAB — POC INFLUENZA A AND B ANTIGEN (URGENT CARE ONLY)
INFLUENZA A ANTIGEN, POC: NEGATIVE
INFLUENZA B ANTIGEN, POC: NEGATIVE

## 2020-12-10 MED ORDER — PROMETHAZINE-DM 6.25-15 MG/5ML PO SYRP: 5.0000 mL | ORAL_SOLUTION | Freq: Every evening | ORAL | 0 refills | Status: DC | PRN

## 2020-12-10 MED ORDER — CETIRIZINE HCL 10 MG PO TABS: 10.0000 mg | ORAL_TABLET | Freq: Every day | ORAL | 0 refills | Status: DC

## 2020-12-10 MED ORDER — BENZONATATE 100 MG PO CAPS: 100.0000 mg | ORAL_CAPSULE | Freq: Three times a day (TID) | ORAL | 0 refills | Status: DC | PRN

## 2020-12-10 MED ORDER — PSEUDOEPHEDRINE HCL 30 MG PO TABS: 30.0000 mg | ORAL_TABLET | Freq: Three times a day (TID) | ORAL | 0 refills | Status: DC | PRN
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
## 2020-12-10 NOTE — ED Triage Notes (Signed)
Pt presents with non productive cough, chills, generalized body aches, and headache X 5 days.

## 2020-12-12 ENCOUNTER — Telehealth: Payer: Self-pay

## 2020-12-12 NOTE — Telephone Encounter (Signed)
Patient was seen in ED.   Nurse: Lorin Picket, RN, Elnita Maxwell Date/Time (Eastern Time): 12/09/2020 2:14:14 PM Confirm and document reason for call. If symptomatic, describe symptoms. ---[name] states he has diarrhea that started on Tuesday night, has chill the next day at work and he left and his Temp was -101 and fever was gone early Thursday morning, body aches, diarrhea started on Tuesday, has had 1 bout of diarrhea yesterday and none today, has chest tightness that comes and goes, is not having tightness, has a cough, sore throat and when he touches his throat it feels like something is stuck in there, is taking Motrin, drinking fluids and voiding, Denies SOB and other symptoms, no known exp to covid in last 14 days   Does the patient have any new or worsening symptoms? ---Yes Will a triage be completed? ---Yes Related visit to physician within the last 2 weeks? ---No Does the PT have any chronic conditions? (i.e. diabetes, asthma, this includes High risk factors for pregnancy, etc.) --No Is this a behavioral health or substance abuse call? ---No  Influenza -  Seasonal  [1] Probable mild influenza (no fever) or a common cold, with no complications  [2] NOT HIGH RISK Lorin Picket RN, Elnita Maxwell 12/09/2020 2:21:12 PM  12/09/2020 2:12:56 PM Send to Urgent Lyndel Safe, Martie Lee 12/09/2020 2:25:57 PM See PCP within 24 Hours Yes Lorin Picket, RN, Elnita Maxwell Disposition Overriden: Home Care Override Reason: Patient's symptoms need a higher level of care [name] Disagree/Comply Comply [name] Understands Yes PreDisposition Call Doctor

## 2020-12-12 NOTE — Telephone Encounter (Signed)
fyi

## 2020-12-20 ENCOUNTER — Other Ambulatory Visit: Payer: BC Managed Care – PPO | Admitting: *Deleted

## 2020-12-20 ENCOUNTER — Other Ambulatory Visit: Payer: Self-pay

## 2020-12-20 ENCOUNTER — Encounter: Payer: Self-pay | Admitting: Family Medicine

## 2020-12-20 DIAGNOSIS — E782 Mixed hyperlipidemia: Secondary | ICD-10-CM

## 2020-12-20 LAB — LIPID PANEL
Chol/HDL Ratio: 3.8 ratio (ref 0.0–5.0)
Cholesterol, Total: 233 mg/dL — ABNORMAL HIGH (ref 100–199)
HDL: 61 mg/dL (ref >39)
LDL Chol Calc (NIH): 112 mg/dL — ABNORMAL HIGH (ref 0–99)
Triglycerides: 348 mg/dL — ABNORMAL HIGH (ref 0–149)
VLDL Cholesterol Cal: 60 mg/dL — ABNORMAL HIGH (ref 5–40)

## 2020-12-22 ENCOUNTER — Encounter: Payer: Self-pay | Admitting: Internal Medicine

## 2020-12-22 DIAGNOSIS — E782 Mixed hyperlipidemia: Secondary | ICD-10-CM

## 2020-12-23 ENCOUNTER — Telehealth: Payer: Self-pay | Admitting: Internal Medicine

## 2020-12-23 MED ORDER — ROSUVASTATIN CALCIUM 5 MG PO TABS: 5.0000 mg | ORAL_TABLET | Freq: Every day | ORAL | 3 refills | Status: DC

## 2020-12-23 NOTE — Telephone Encounter (Signed)
Returned call to Pt.  Gave results and recommendations.  Medication sent to pharmacy as requested.

## 2020-12-23 NOTE — Telephone Encounter (Signed)
Pt is calling regarding his lab results from 12/20/20

## 2020-12-30 NOTE — Telephone Encounter (Signed)
Called pt to ensure all questions regarding rosuvastatin were answered.  Pt expresses taking med as ordered.  3 month fu FLP and ALT scheduled for 03/27/21.  Pt verbalizes understanding.

## 2020-12-30 NOTE — Telephone Encounter (Signed)
-----   Message from Christell Constant, MD sent at 12/22/2020 10:37 AM EST ----- Results: TGS and LDL improved but still elevated Plan: Over rosuvastatin 5 mg PO daily  Christell Constant, MD

## 2021-01-22 ENCOUNTER — Emergency Department (HOSPITAL_COMMUNITY)
Admission: EM | Admit: 2021-01-22 | Discharge: 2021-01-22 | Disposition: A | Discharge status: Home / Self Care | Payer: BC Managed Care – PPO | Attending: Emergency Medicine | Admitting: Emergency Medicine

## 2021-01-22 ENCOUNTER — Encounter (HOSPITAL_COMMUNITY): Payer: Self-pay | Admitting: Emergency Medicine

## 2021-01-22 ENCOUNTER — Emergency Department (HOSPITAL_COMMUNITY): Payer: BC Managed Care – PPO

## 2021-01-22 ENCOUNTER — Other Ambulatory Visit: Payer: Self-pay

## 2021-01-22 DIAGNOSIS — R519 Headache, unspecified: Secondary | ICD-10-CM | POA: Diagnosis not present

## 2021-01-22 DIAGNOSIS — R0789 Other chest pain: Secondary | ICD-10-CM | POA: Diagnosis not present

## 2021-01-22 DIAGNOSIS — R079 Chest pain, unspecified: Secondary | ICD-10-CM | POA: Insufficient documentation

## 2021-01-22 DIAGNOSIS — Z79899 Other long term (current) drug therapy: Secondary | ICD-10-CM | POA: Insufficient documentation

## 2021-01-22 DIAGNOSIS — R410 Disorientation, unspecified: Secondary | ICD-10-CM

## 2021-01-22 HISTORY — DX: Essential (primary) hypertension: I10

## 2021-01-22 LAB — CBC WITH DIFFERENTIAL/PLATELET
Abs Immature Granulocytes: 0.03 10*3/uL (ref 0.00–0.07)
Basophils Absolute: 0 10*3/uL (ref 0.0–0.1)
Basophils Relative: 1 %
Eosinophils Absolute: 0.3 10*3/uL (ref 0.0–0.5)
Eosinophils Relative: 5 %
HCT: 43 % (ref 39.0–52.0)
Hemoglobin: 14.5 g/dL (ref 13.0–17.0)
Immature Granulocytes: 1 %
Lymphocytes Relative: 36 %
Lymphs Abs: 2 10*3/uL (ref 0.7–4.0)
MCH: 32 pg (ref 26.0–34.0)
MCHC: 33.7 g/dL (ref 30.0–36.0)
MCV: 94.9 fL (ref 80.0–100.0)
Monocytes Absolute: 0.6 10*3/uL (ref 0.1–1.0)
Monocytes Relative: 10 %
Neutro Abs: 2.6 10*3/uL (ref 1.7–7.7)
Neutrophils Relative %: 47 %
Platelets: 299 10*3/uL (ref 150–400)
RBC: 4.53 MIL/uL (ref 4.22–5.81)
RDW: 11.5 % (ref 11.5–15.5)
WBC: 5.6 10*3/uL (ref 4.0–10.5)
nRBC: 0 % (ref 0.0–0.2)

## 2021-01-22 LAB — TROPONIN I (HIGH SENSITIVITY)
Troponin I (High Sensitivity): 5 ng/L (ref <18)
Troponin I (High Sensitivity): 5 ng/L (ref <18)

## 2021-01-22 LAB — BASIC METABOLIC PANEL
Anion gap: 9 (ref 5–15)
BUN: 21 mg/dL — ABNORMAL HIGH (ref 6–20)
CO2: 24 mmol/L (ref 22–32)
Calcium: 9.6 mg/dL (ref 8.9–10.3)
Chloride: 101 mmol/L (ref 98–111)
Creatinine, Ser: 1.11 mg/dL (ref 0.61–1.24)
GFR, Estimated: >60 mL/min (ref >60)
Glucose, Bld: 114 mg/dL — ABNORMAL HIGH (ref 70–99)
Potassium: 4.5 mmol/L (ref 3.5–5.1)
Sodium: 134 mmol/L — ABNORMAL LOW (ref 135–145)

## 2021-01-22 NOTE — Discharge Instructions (Signed)
The testing today did not show any problems such as stroke or heart attack.  The testing is reassuring.  If you have continued symptoms or other concerns, follow-up with your primary care doctor.

## 2021-01-22 NOTE — ED Provider Notes (Signed)
[facility]-EMERGENCY DEPT Provider Note   CSN: [id number] Arrival date & time: 01/22/21  1624     History  Chief Complaint  Patient presents with   Chest Pain    Collin Kaiser is a 46 y.o. male.  HPI He is at work tonight when he developed a feeling of confusion and then felt like he was having trouble walking.  He also notes that time and numbness in his chest, and left arm.  Today he has noted a headache which comes and goes.  He denies blurred vision, neck pain, back pain, nausea or vomiting.  He has had similar symptoms previously.  He denies prior history of intracranial bleeding, heart attack, pulmonary blood clots, or hypertension.  He has been told he may have stress but he states he does not.  He works a job as an Retail buyer, which Designer, industrial/product and meetings frequently.  There are no other known active modifying factors.    Home Medications Prior to Admission medications   Medication Sig Start Date End Date Taking? Authorizing Provider  albuterol (VENTOLIN HFA) 108 (90 Base) MCG/ACT inhaler Inhale 1-2 puffs into the lungs every 6 (six) hours as needed for wheezing or shortness of breath. 12/21/19   Orland Mustard, MD  benzonatate (TESSALON) 100 MG capsule Take 1-2 capsules (100-200 mg total) by mouth 3 (three) times daily as needed for cough. 12/10/20   Wallis Bamberg, PA-C  cetirizine (ZYRTEC ALLERGY) 10 MG tablet Take 1 tablet (10 mg total) by mouth daily. 12/10/20   Wallis Bamberg, PA-C  EPINEPHrine 0.3 mg/0.3 mL IJ SOAJ injection Inject 0.3 mg into the muscle once as needed. Allergic reaction 05/03/20   Worthy Rancher B, FNP  multivitamin (ONE-A-DAY MEN'S) TABS tablet Take 1 tablet by mouth daily.    [provider]  naltrexone (DEPADE) 50 MG tablet Take 1 tablet (50 mg total) by mouth daily. 02/15/20   Orland Mustard, MD  promethazine-dextromethorphan (PROMETHAZINE-DM) 6.25-15 MG/5ML syrup Take 5 mLs by mouth at bedtime as needed for cough.  12/10/20   Wallis Bamberg, PA-C  pseudoephedrine (SUDAFED) 30 MG tablet Take 1 tablet (30 mg total) by mouth every 8 (eight) hours as needed for congestion. 12/10/20   Wallis Bamberg, PA-C  rosuvastatin (CRESTOR) 5 MG tablet Take 1 tablet (5 mg total) by mouth daily. 12/23/20 03/23/21  Christell Constant, MD  Vitamin D, Ergocalciferol, (DRISDOL) 1.25 MG (50000 UNIT) CAPS capsule One capsule by mouth once a week for 12 weeks. Then take 2000IU/day 02/17/20   Orland Mustard, MD      Allergies    Shellfish allergy, Lactose, and Tamiflu [oseltamivir phosphate]    Review of Systems   Review of Systems  All other systems reviewed and are negative.  Physical Exam Updated Vital Signs BP (!) 139/95    Pulse 80    Temp 98.5 F (36.9 C) (Oral)    Resp 17    SpO2 100%  Physical Exam Vitals and nursing note reviewed.  Constitutional:      General: He is not in acute distress.    Appearance: He is well-developed. He is not ill-appearing, toxic-appearing or diaphoretic.  HENT:     Head: Normocephalic and atraumatic.     Right Ear: External ear normal.     Left Ear: External ear normal.     Nose: No congestion.     Mouth/Throat:     Pharynx: No oropharyngeal exudate.  Eyes:     Conjunctiva/sclera: Conjunctivae normal.  Pupils: Pupils are equal, round, and reactive to light.  Neck:     Trachea: Phonation normal.  Cardiovascular:     Rate and Rhythm: Normal rate and regular rhythm.     Heart sounds: Normal heart sounds.  Pulmonary:     Effort: Pulmonary effort is normal.     Breath sounds: Normal breath sounds.  Abdominal:     General: There is no distension.     Palpations: Abdomen is soft.     Tenderness: There is no abdominal tenderness.  Musculoskeletal:        General: Normal range of motion.     Cervical back: Normal range of motion and neck supple.  Skin:    General: Skin is warm and dry.  Neurological:     Mental Status: He is alert and oriented to person, place, and time.      Cranial Nerves: No cranial nerve deficit.     Sensory: No sensory deficit.     Motor: No abnormal muscle tone.     Coordination: Coordination normal.     Comments: No dysarthria, aphasia or nystagmus.  Psychiatric:        Mood and Affect: Mood normal.        Behavior: Behavior normal.        Thought Content: Thought content normal.        Judgment: Judgment normal.    ED Results / Procedures / Treatments   Labs (all labs ordered are listed, but only abnormal results are displayed) Labs Reviewed  BASIC METABOLIC PANEL - Abnormal; Notable for the following components:      Result Value   Sodium 134 (*)    Glucose, Bld 114 (*)    BUN 21 (*)    All other components within normal limits  CBC WITH DIFFERENTIAL/PLATELET  TROPONIN I (HIGH SENSITIVITY)  TROPONIN I (HIGH SENSITIVITY)    EKG EKG Interpretation  Date/Time:  Sunday January 22 2021 20:52:16 EST Ventricular Rate:  80 PR Interval:  152 QRS Duration: 73 QT Interval:  379 QTC Calculation: 438 R Axis:   57 Text Interpretation: Sinus rhythm Borderline ST elevation, lateral leads since last tracing no significant change Confirmed by Mancel Bale [phone]) on 01/22/2021 9:38:28 PM  Radiology DG Chest 2 View  Result Date: 01/22/2021 CLINICAL DATA:  Chest pain EXAM: CHEST - 2 VIEW COMPARISON:  05/25/2020 FINDINGS: The heart size and mediastinal contours are within normal limits. Both lungs are clear. The visualized skeletal structures are unremarkable. IMPRESSION: No acute abnormality of the lungs. Electronically Signed   By: Jearld Lesch M.D.   On: 01/22/2021 17:22    Procedures Procedures    Medications Ordered in ED Medications - No data to display  ED Course/ Medical Decision Making/ A&P                           Medical Decision Making   Patient Vitals for the past 24 hrs:  BP Temp Temp src Pulse Resp SpO2  01/22/21 1952 (!) 139/95 -- -- 80 17 100 %  01/22/21 1700 -- 98.5 F (36.9 C) Oral -- -- --  01/22/21  1657 (!) 150/95 -- -- 89 16 98 %    10:47 PM Reevaluation with update and discussion with the pain. After initial assessment and treatment, an updated evaluation reveals he states he feels back to normal at this time. Illness risk, return of symptoms, discussed. Mancel Bale    Medical Decision Making:  Summary of Illness/Injury: Confusion and difficulty walking started today at work.  Similar problems previously when he was noted to be stressed.  No prior history of CVA.  He does have history of hypercholesterolemia.  He was evaluated for cough and had high resolution CT imaging done recently.  Critical Interventions-clinical hours, after testing, radiography, observation and reassessment; to evaluate  Chief Complaint  Patient presents with   Chest Pain    and assess for illness characterized as Acute and Self-Limited   The Differential Diagnoses include stress, anxiety, substance abuse, malaise.  I did not require  Additional Historical Information from Anyone, as the patient is a good historian.  I decided to review pertinent External Data, and in summary previously followed by PCP for multiple problems including former substance abuse, taking naltrexone, history of habitual alcohol use, chronic cough and generalized anxiety disorder.  Last PCP visit, 11/17/2020.  He had an urgent care visit on 12/10/2020 for viral URI.   Clinical Laboratory Tests Ordered, included CBC, Metabolic panel, and troponin I . Review indicates normal. Emergent testing abnormality management required for stabilization-no  Radiologic Tests Ordered, included CT head, chest x-ray.  I independently Visualized: Radiographic images, which show no acute abnormalities  Cardiac Monitor Tracing which shows normal sinus rhythm, indicating stable cardiac rhythm    This patient is Presenting for Evaluation of constellation of symptoms including confusion, headache and difficulty walking, which does require a range of  treatment options, and is a complaint that involves a moderate risk of morbidity and mortality.  Pharmaceutical Risk Management no   S  Treatment Complication Risk Evaluation indicates discharge from ED is appropriate  After These Interventions, the Patient was reevaluated and was found stable for discharge.  Unclear constellation of symptoms, evaluation is not consistent with CVA, ACS, acute infectious or metabolic disorders.  Doubt vascular insufficiency or sepsis.  No indication for further ED evaluation or hospitalization at this time.  Patient counseled to return or see his PCP for ongoing symptoms.   CRITICAL CARE-no Performed by: Mancel BaleElliott Isabel Ardila              Final Clinical Impression(s) / ED Diagnoses Final diagnoses:  None    Rx / DC Orders ED Discharge Orders     None         Mancel BaleWentz, Yamari Ventola, MD 01/22/21 2255

## 2021-01-22 NOTE — ED Triage Notes (Signed)
Patient arrives ambulatory. Patient reports intermittent chest pain and left arm numbness onset of 2 days ago. Patient reports feeling confused today while at work. States he kept forgetting what he was doing. Describes chest pain as a stabbing pain on the left side.

## 2021-01-22 NOTE — ED Provider Triage Note (Signed)
Emergency Medicine Provider Triage Evaluation Note  Simon Llamas , a 46 y.o. male  was evaluated in triage.  Pt complains of chest pain.  Patient states that for 2 days he has had intermittent, left-sided stabbing chest pain.  States that the pain has intermittently radiated down his left arm and once to his jaw.  He states that it has been associated with diaphoresis.  He denies nausea or vomiting.  He states that yesterday at work he felt somewhat lightheaded and felt like he needed to sit down and drink some juice.  He denies back pain, abdominal pain, fevers.  Review of Systems  Positive: See above Negative:   Physical Exam  BP (!) 150/95 (BP Location: Left Arm)    Pulse 89    Resp 16    SpO2 98%  Gen:   Awake, no distress   Resp:  Normal effort  MSK:   Moves extremities without difficulty  Other:  S1/S2 without murmur  Medical Decision Making  Medically screening exam initiated at 5:01 PM.  Appropriate orders placed.  Saunders Arlington was informed that the remainder of the evaluation will be completed by another provider, this initial triage assessment does not replace that evaluation, and the importance of remaining in the ED until their evaluation is complete.     Cristopher Peru, PA-C 01/22/21 1702

## 2021-01-26 ENCOUNTER — Other Ambulatory Visit: Payer: Self-pay

## 2021-01-26 ENCOUNTER — Encounter (HOSPITAL_COMMUNITY): Payer: Self-pay

## 2021-01-26 ENCOUNTER — Emergency Department (HOSPITAL_COMMUNITY)
Admission: EM | Admit: 2021-01-26 | Discharge: 2021-01-26 | Disposition: A | Discharge status: Home / Self Care | Payer: BC Managed Care – PPO | Attending: Emergency Medicine | Admitting: Emergency Medicine

## 2021-01-26 DIAGNOSIS — Y902 Blood alcohol level of 40-59 mg/100 ml: Secondary | ICD-10-CM | POA: Insufficient documentation

## 2021-01-26 DIAGNOSIS — Z79899 Other long term (current) drug therapy: Secondary | ICD-10-CM | POA: Insufficient documentation

## 2021-01-26 DIAGNOSIS — R42 Dizziness and giddiness: Secondary | ICD-10-CM | POA: Diagnosis not present

## 2021-01-26 DIAGNOSIS — F10239 Alcohol dependence with withdrawal, unspecified: Secondary | ICD-10-CM | POA: Diagnosis not present

## 2021-01-26 DIAGNOSIS — F10230 Alcohol dependence with withdrawal, uncomplicated: Secondary | ICD-10-CM | POA: Diagnosis not present

## 2021-01-26 DIAGNOSIS — F10930 Alcohol use, unspecified with withdrawal, uncomplicated: Secondary | ICD-10-CM

## 2021-01-26 LAB — CBC
HCT: 41.1 % (ref 39.0–52.0)
Hemoglobin: 13.8 g/dL (ref 13.0–17.0)
MCH: 31.9 pg (ref 26.0–34.0)
MCHC: 33.6 g/dL (ref 30.0–36.0)
MCV: 95.1 fL (ref 80.0–100.0)
Platelets: 306 10*3/uL (ref 150–400)
RBC: 4.32 MIL/uL (ref 4.22–5.81)
RDW: 11.2 % — ABNORMAL LOW (ref 11.5–15.5)
WBC: 5.7 10*3/uL (ref 4.0–10.5)
nRBC: 0 % (ref 0.0–0.2)

## 2021-01-26 LAB — COMPREHENSIVE METABOLIC PANEL
ALT: 78 U/L — ABNORMAL HIGH (ref 0–44)
AST: 53 U/L — ABNORMAL HIGH (ref 15–41)
Albumin: 4.6 g/dL (ref 3.5–5.0)
Alkaline Phosphatase: 71 U/L (ref 38–126)
Anion gap: 9 (ref 5–15)
BUN: 14 mg/dL (ref 6–20)
CO2: 24 mmol/L (ref 22–32)
Calcium: 9.5 mg/dL (ref 8.9–10.3)
Chloride: 102 mmol/L (ref 98–111)
Creatinine, Ser: 0.93 mg/dL (ref 0.61–1.24)
GFR, Estimated: >60 mL/min (ref >60)
Glucose, Bld: 111 mg/dL — ABNORMAL HIGH (ref 70–99)
Potassium: 4 mmol/L (ref 3.5–5.1)
Sodium: 135 mmol/L (ref 135–145)
Total Bilirubin: 0.9 mg/dL (ref 0.3–1.2)
Total Protein: 8.7 g/dL — ABNORMAL HIGH (ref 6.5–8.1)

## 2021-01-26 LAB — ETHANOL: Alcohol, Ethyl (B): 54 mg/dL — ABNORMAL HIGH (ref <10)

## 2021-01-26 LAB — RAPID URINE DRUG SCREEN, HOSP PERFORMED
Amphetamines: NOT DETECTED
Barbiturates: NOT DETECTED
Benzodiazepines: NOT DETECTED
Cocaine: NOT DETECTED
Opiates: NOT DETECTED
Tetrahydrocannabinol: NOT DETECTED

## 2021-01-26 MED ORDER — CHLORDIAZEPOXIDE HCL 25 MG PO CAPS: ORAL_CAPSULE | ORAL | 0 refills | Status: DC

## 2021-01-26 NOTE — ED Triage Notes (Signed)
Pt reports he experienced withdrawal symptoms at work today around 2:30: dizziness, trouble focusing, numbness to lips. He normally drinks a 5th of a bottle of rum each night, including last night. After work today he went home and drank one can of beer today "to calm down my symptoms."

## 2021-01-26 NOTE — ED Provider Triage Note (Signed)
Emergency Medicine Provider Triage Evaluation Note  Collin Kaiser , a 46 y.o. male  was evaluated in triage.  Pt complains of gradual onset, constant, heaviness feeling in his head that began earlier today at work.  He also complains of a tingling sensation in his head and lips.  He states that he also felt dizzy and had trouble focusing.  He states that he has gone through alcohol withdrawal before and this felt very similar.  He drinks about fifth of rum each night and last drink last night.  He states that his daughter picked him up from work and drove him here however on the way he stopped at a gas station and drank a beer and states that his symptoms are now dissipating.  He does admit that he is currently seeing a coach and trying to wean off of alcohol however states that he messed up recently.  He plans to see his coach again tomorrow.  Review of Systems  Positive: + head fullness, dizziness, tingling sensation, decreased focus Negative: - CP, SOB, hallucinations  Physical Exam  BP (!) 155/108 (BP Location: Left Arm)    Pulse 88    Temp 98.3 F (36.8 C) (Oral)    Resp 18    Ht 5\' 6"  (1.676 m)    Wt 94.3 kg    SpO2 95%    BMI 33.57 kg/m  Gen:   Awake, no distress   Resp:  Normal effort  MSK:   Moves extremities without difficulty  Other:    Medical Decision Making  Medically screening exam initiated at 6:24 PM.  Appropriate orders placed.  Najib Colmenares was informed that the remainder of the evaluation will be completed by another provider, this initial triage assessment does not replace that evaluation, and the importance of remaining in the ED until their evaluation is complete.     Shirley Muscat, PA-C 01/26/21 1825

## 2021-01-26 NOTE — Discharge Instructions (Addendum)
Please pick up medications and take as prescribed   Follow up with your PCP for further evaluation  Return to the ED for any new/worsening symptoms

## 2021-01-26 NOTE — ED Provider Notes (Signed)
[facility]-EMERGENCY DEPT Provider Note   CSN: [id number] Arrival date & time: 01/26/21  1802     History  Chief Complaint  Patient presents with   Alcohol Problem    Collin MuscatKerron Kaiser is a 46 y.o. male who presents to the ED today with complaint of gradual onset, constant, heaviness feeling in his head that began earlier today at work.  He also complains of a tingling sensation in his head and lips.  He states that he also felt dizzy and had trouble focusing.  He states that he has gone through alcohol withdrawal before and this felt very similar.  He drinks about fifth of rum each night and last drink last night.  He states that his daughter picked him up from work and drove him here however on the way he stopped at a gas station and drank a beer and states that his symptoms are now dissipating.  He does admit that he is currently seeing a coach and trying to wean off of alcohol however states that he messed up recently.  He plans to see his coach again tomorrow. No SI, HI, or AVH.   The history is provided by the patient and medical records.      Home Medications Prior to Admission medications   Medication Sig Start Date End Date Taking? Authorizing Provider  chlordiazePOXIDE (LIBRIUM) 25 MG capsule 50mg  PO TID x 1D, then 25-50mg  PO BID X 1D, then 25-50mg  PO QD X 1D 01/26/21  Yes Jamarco Zaldivar, PA-C  albuterol (VENTOLIN HFA) 108 (90 Base) MCG/ACT inhaler Inhale 1-2 puffs into the lungs every 6 (six) hours as needed for wheezing or shortness of breath. 12/21/19   Orland MustardWolfe, Allison, MD  benzonatate (TESSALON) 100 MG capsule Take 1-2 capsules (100-200 mg total) by mouth 3 (three) times daily as needed for cough. 12/10/20   Wallis BambergMani, Mario, PA-C  cetirizine (ZYRTEC ALLERGY) 10 MG tablet Take 1 tablet (10 mg total) by mouth daily. 12/10/20   Wallis BambergMani, Mario, PA-C  EPINEPHrine 0.3 mg/0.3 mL IJ SOAJ injection Inject 0.3 mg into the muscle once as needed. Allergic reaction 05/03/20    Worthy RancherWebb, Padonda B, FNP  multivitamin (ONE-A-DAY MEN'S) TABS tablet Take 1 tablet by mouth daily.    [provider]  naltrexone (DEPADE) 50 MG tablet Take 1 tablet (50 mg total) by mouth daily. 02/15/20   Orland MustardWolfe, Allison, MD  promethazine-dextromethorphan (PROMETHAZINE-DM) 6.25-15 MG/5ML syrup Take 5 mLs by mouth at bedtime as needed for cough. 12/10/20   Wallis BambergMani, Mario, PA-C  pseudoephedrine (SUDAFED) 30 MG tablet Take 1 tablet (30 mg total) by mouth every 8 (eight) hours as needed for congestion. 12/10/20   Wallis BambergMani, Mario, PA-C  rosuvastatin (CRESTOR) 5 MG tablet Take 1 tablet (5 mg total) by mouth daily. 12/23/20 03/23/21  Christell Constanthandrasekhar, Mahesh A, MD  Vitamin D, Ergocalciferol, (DRISDOL) 1.25 MG (50000 UNIT) CAPS capsule One capsule by mouth once a week for 12 weeks. Then take 2000IU/day 02/17/20   Orland MustardWolfe, Allison, MD      Allergies    Shellfish allergy, Lactose, and Tamiflu [oseltamivir phosphate]    Review of Systems   Review of Systems  Constitutional:  Negative for chills and fever.  Cardiovascular:  Negative for chest pain and palpitations.  Gastrointestinal:  Negative for abdominal pain, nausea and vomiting.  Neurological:  Positive for dizziness. Negative for syncope, speech difficulty and headaches.  Psychiatric/Behavioral:  Negative for hallucinations and suicidal ideas.   All other systems reviewed and are negative.  Physical  Exam Updated Vital Signs BP (!) 151/104    Pulse 90    Temp 98.3 F (36.8 C) (Oral)    Resp 18    Ht 5\' 6"  (1.676 m)    Wt 94.3 kg    SpO2 96%    BMI 33.57 kg/m  Physical Exam Vitals and nursing note reviewed.  Constitutional:      Appearance: He is not ill-appearing or diaphoretic.  HENT:     Head: Normocephalic and atraumatic.  Eyes:     Conjunctiva/sclera: Conjunctivae normal.  Cardiovascular:     Rate and Rhythm: Normal rate and regular rhythm.     Pulses: Normal pulses.  Pulmonary:     Effort: Pulmonary effort is normal.     Breath sounds:  Normal breath sounds. No wheezing, rhonchi or rales.  Abdominal:     Palpations: Abdomen is soft.     Tenderness: There is no abdominal tenderness.  Musculoskeletal:     Cervical back: Neck supple.  Skin:    General: Skin is warm and dry.  Neurological:     General: No focal deficit present.     Mental Status: He is alert and oriented to person, place, and time.    ED Results / Procedures / Treatments   Labs (all labs ordered are listed, but only abnormal results are displayed) Labs Reviewed  COMPREHENSIVE METABOLIC PANEL - Abnormal; Notable for the following components:      Result Value   Glucose, Bld 111 (*)    Total Protein 8.7 (*)    AST 53 (*)    ALT 78 (*)    All other components within normal limits  ETHANOL - Abnormal; Notable for the following components:   Alcohol, Ethyl (B) 54 (*)    All other components within normal limits  CBC - Abnormal; Notable for the following components:   RDW 11.2 (*)    All other components within normal limits  RAPID URINE DRUG SCREEN, HOSP PERFORMED    EKG None  Radiology No results found.  Procedures Procedures    Medications Ordered in ED Medications - No data to display  ED Course/ Medical Decision Making/ A&P                           Medical Decision Making 46 year old male who presents to the ED today with complaint of dizziness and a tingling sensation in his head with decreased concentration.  Reports history of alcohol withdrawal and states this feels similar.  Last drink last night.  Drinks about 1/5 of rum each night.  Drink beer prior to arrival and states he feels better at this time.  On arrival to the ED today vitals are stable.  Patient appears to be no acute distress.  He is alert and oriented x4.  Is medically screened by myself and work-up started.  Problems Addressed: Alcohol withdrawal syndrome without complication (HCC): acute illness or injury that poses a threat to life or bodily functions     Details: Patient experiencing alcohol withdrawal syndrome prior to being seen however drink beer prior to arrival and states his symptoms resolved. Work-up overall reassuring at this time and EtOH slightly elevated at 54.  His CIWA score is currently 3.  Do not feel he requires ativan at this time. He was prescribed librium in the past and states it helped prior to "slipping" up. WIll discharge home with same at this time. STable for discharge.  Amount and/or  Complexity of Data Reviewed Labs: ordered.    Details: CBC without leukocytosis. Hgb stable at 13.8 CMP with glucose 111. AST and ALT slightly elevated at 53/78.  EtOH positive at 54 UDS negative          Final Clinical Impression(s) / ED Diagnoses Final diagnoses:  Alcohol withdrawal syndrome without complication (HCC)    Rx / DC Orders ED Discharge Orders          Ordered    chlordiazePOXIDE (LIBRIUM) 25 MG capsule        01/26/21 2112             Discharge Instructions      Please pick up medications and take as prescribed   Follow up with your PCP for further evaluation  Return to the ED for any new/worsening symptoms         Tanda Rockers, Cordelia Poche 01/26/21 2112    Mancel Bale, MD 01/27/21 1257

## 2021-01-31 ENCOUNTER — Telehealth: Payer: Self-pay | Admitting: Family Medicine

## 2021-01-31 NOTE — Telephone Encounter (Signed)
Called pt and scheduled an earlier visit for 02/01/21 @ 2:30pm with Dr Collin Kaiser.    Patient Name: Collin Kaiser Gender: Male DOB: 1975-12-18 Age: 46 Y 16 D Return Phone Number: [phone] (Primary) Address: City/ State/ Zip: [place]  [address] Client [facility] - [facility], [facility] Provider Collin Kaiser- MD Contact Type Call Who Is Calling Patient / Member / Family / Caregiver Call Type Triage / Clinical Relationship To Patient Self Return Phone Number [phone] (Primary) Chief Complaint Health information question (non symptomatic) Reason for Call Symptomatic / Request for Health Information Initial Comment [name] states he is wanting to speak to nurse regarding blood pressure.He was seen in ER and did not know he had high blood pressure Additional Comment transferred from office. Translation No Nurse Assessment Nurse: Collin Dawes, RN, Collin Kaiser Date/Time (Eastern Time): 01/31/2021 11:30:29 AM Confirm and document reason for call. If symptomatic, describe symptoms. ---[name] states he is wanting to speak to nurse regarding blood pressure. He was seen in ER last week for chest pain and alcohol withdrawal and did not know he had high blood pressure. They told him it was high but he does not remember the numbers they told him. Does the patient have any new or worsening symptoms? ---Yes Will a triage be completed? ---Yes Related visit to physician within the last 2 weeks? ---Yes Does the PT have any chronic conditions? (i.e. diabetes, asthma, this includes High risk factors for pregnancy, etc.) ---Yes List chronic conditions. ---alcoholism, high cholesterol Is this a behavioral health or substance abuse call? ---No Guidelines Guideline Title Affirmed Question Affirmed Notes Nurse Date/Time (Eastern Time) Blood Pressure - High Systolic BP >= 160 OR Diastolic >= 100 Daves,  RN, Collin Kaiser 01/31/2021 11:34:19 AM Disp. Time Collin Kaiser Time) Disposition Final User 01/31/2021 11:41:33 AM SEE PCP WITHIN 3 DAYS Yes Daves, RN, Collin Kaiser Disagree/Comply Comply [name] Understands Yes PreDisposition Call Doctor Care Advice Given Per Guideline SEE PCP WITHIN 3 DAYS: * You need to be seen within 2 or 3 days. * EAT HEALTHY: Eat a diet rich in fresh fruits and vegetables, dietary fiber, non-animal protein (e.g., soy), and low-fat dairy products. Avoid foods with a high content of saturated fat or cholesterol. * DECREASE SODIUM INTAKE: Aim to eat less than 2.4 g (100 mmol) of sodium each day. Unfortunately 75% of the salt in the average person's diet is in pre-processed foods. * LIMIT ALCOHOL: Limit alcoholic beverages to no more than one drink per day for women and no more than two drinks for men. A drink is 1.5 oz hard liquor (one shot or jigger; 45 ml), 5 oz wine (small glass; 150 ml), 12 oz beer (one can; 360 ml). * EXERCISE, BE MORE PHYSICALLY ACTIVE: Do at least 30 minutes of aerobic exercise (e.g., brisk walking) most days of the week. Other examples of aerobic activities cycling, jogging, and swimming. * REDUCE WEIGHT AND WAISTLINE: It is important to maintain a normal body weight. The goal should be a BMI (body mass index) under 25 for men and women, a waist circumference under 40 inches (102 cm) in men, and a waist circumference under 35 inches (88 cm) in women. * REDUCE STRESS: Find activities that help reduce your stress. Examples might include meditation, yoga, or even a restful walk in a park. CALL BACK IF: * Weakness or numbness of the face, arm or leg on one side of the body occurs * Difficulty walking, difficulty  talking, or severe headache occurs * Chest pain or difficulty breathing occurs * Your blood pressure is over 180/110 * You become worse CARE ADVICE given per High Blood Pressure (Adult) guideline. Comments User: Collin Slicker, RN Date/Time Collin Kaiser  Time): 01/31/2021 11:43:03 AM [name] states has appt for next Tuesday with PCP. Advised to monitor bp at home and call back if high or symptomatic. Referrals REFERRED TO PCP OFFICE

## 2021-02-01 ENCOUNTER — Ambulatory Visit: Payer: BC Managed Care – PPO | Admitting: Family Medicine

## 2021-02-01 NOTE — Telephone Encounter (Signed)
Noted  

## 2021-02-03 ENCOUNTER — Ambulatory Visit (INDEPENDENT_AMBULATORY_CARE_PROVIDER_SITE_OTHER): Payer: BC Managed Care – PPO | Admitting: Family Medicine

## 2021-02-03 ENCOUNTER — Other Ambulatory Visit: Payer: Self-pay

## 2021-02-03 ENCOUNTER — Encounter: Payer: Self-pay | Admitting: Family Medicine

## 2021-02-03 VITALS — BP 150/80 | HR 84 | Temp 98.4°F | Ht 64.0 in | Wt 224.5 lb

## 2021-02-03 DIAGNOSIS — R0789 Other chest pain: Secondary | ICD-10-CM | POA: Diagnosis not present

## 2021-02-03 DIAGNOSIS — I10 Essential (primary) hypertension: Secondary | ICD-10-CM | POA: Diagnosis not present

## 2021-02-03 DIAGNOSIS — F1911 Other psychoactive substance abuse, in remission: Secondary | ICD-10-CM

## 2021-02-03 MED ORDER — LOSARTAN POTASSIUM 25 MG PO TABS: 25.0000 mg | ORAL_TABLET | Freq: Every day | ORAL | 0 refills | Status: DC

## 2021-02-03 NOTE — Patient Instructions (Addendum)
Dr. Michiel Sites Pulmonary  Stay off alcohol  Keep appointment with Dr. Mardelle Matte  Check blood pressure daily.  Goal is <130/80  Stretch shoulders and neck
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
## 2021-02-06 ENCOUNTER — Ambulatory Visit: Payer: BC Managed Care – PPO | Admitting: Family Medicine

## 2021-02-20 ENCOUNTER — Ambulatory Visit (INDEPENDENT_AMBULATORY_CARE_PROVIDER_SITE_OTHER): Payer: BC Managed Care – PPO | Admitting: Family Medicine

## 2021-02-20 ENCOUNTER — Encounter: Payer: Self-pay | Admitting: Family Medicine

## 2021-02-20 ENCOUNTER — Other Ambulatory Visit: Payer: Self-pay

## 2021-02-20 VITALS — BP 122/60 | HR 75 | Temp 98.0°F | Ht 64.0 in | Wt 216.5 lb

## 2021-02-20 DIAGNOSIS — F1020 Alcohol dependence, uncomplicated: Secondary | ICD-10-CM

## 2021-02-20 DIAGNOSIS — F411 Generalized anxiety disorder: Secondary | ICD-10-CM | POA: Diagnosis not present

## 2021-02-20 DIAGNOSIS — Z0000 Encounter for general adult medical examination without abnormal findings: Secondary | ICD-10-CM

## 2021-02-20 DIAGNOSIS — E782 Mixed hyperlipidemia: Secondary | ICD-10-CM | POA: Diagnosis not present

## 2021-02-20 DIAGNOSIS — I10 Essential (primary) hypertension: Secondary | ICD-10-CM | POA: Diagnosis not present

## 2021-02-20 LAB — CBC WITH DIFFERENTIAL/PLATELET
Basophils Absolute: 0.1 10*3/uL (ref 0.0–0.1)
Basophils Relative: 1.1 % (ref 0.0–3.0)
Eosinophils Absolute: 0.3 10*3/uL (ref 0.0–0.7)
Eosinophils Relative: 6.7 % — ABNORMAL HIGH (ref 0.0–5.0)
HCT: 42.2 % (ref 39.0–52.0)
Hemoglobin: 13.9 g/dL (ref 13.0–17.0)
Lymphocytes Relative: 41 % (ref 12.0–46.0)
Lymphs Abs: 2 10*3/uL (ref 0.7–4.0)
MCHC: 32.9 g/dL (ref 30.0–36.0)
MCV: 93.7 fl (ref 78.0–100.0)
Monocytes Absolute: 0.5 10*3/uL (ref 0.1–1.0)
Monocytes Relative: 10 % (ref 3.0–12.0)
Neutro Abs: 2 10*3/uL (ref 1.4–7.7)
Neutrophils Relative %: 41.2 % — ABNORMAL LOW (ref 43.0–77.0)
Platelets: 301 10*3/uL (ref 150.0–400.0)
RBC: 4.51 Mil/uL (ref 4.22–5.81)
RDW: 11.8 % (ref 11.5–15.5)
WBC: 4.8 10*3/uL (ref 4.0–10.5)

## 2021-02-20 LAB — COMPREHENSIVE METABOLIC PANEL
ALT: 35 U/L (ref 0–53)
AST: 28 U/L (ref 0–37)
Albumin: 4.5 g/dL (ref 3.5–5.2)
Alkaline Phosphatase: 61 U/L (ref 39–117)
BUN: 19 mg/dL (ref 6–23)
CO2: 28 mEq/L (ref 19–32)
Calcium: 9.6 mg/dL (ref 8.4–10.5)
Chloride: 100 mEq/L (ref 96–112)
Creatinine, Ser: 0.95 mg/dL (ref 0.40–1.50)
GFR: 96.94 mL/min (ref >60.00)
Glucose, Bld: 109 mg/dL — ABNORMAL HIGH (ref 70–99)
Potassium: 4.3 mEq/L (ref 3.5–5.1)
Sodium: 134 mEq/L — ABNORMAL LOW (ref 135–145)
Total Bilirubin: 1.1 mg/dL (ref 0.2–1.2)
Total Protein: 8.1 g/dL (ref 6.0–8.3)

## 2021-02-20 LAB — LIPID PANEL
Cholesterol: 154 mg/dL (ref 0–200)
HDL: 59.4 mg/dL (ref >39.00)
LDL Cholesterol: 66 mg/dL (ref 0–99)
NonHDL: 94.13
Total CHOL/HDL Ratio: 3
Triglycerides: 140 mg/dL (ref 0.0–149.0)
VLDL: 28 mg/dL (ref 0.0–40.0)

## 2021-02-20 LAB — TSH: TSH: 0.45 u[IU]/mL (ref 0.35–5.50)

## 2021-02-20 MED ORDER — LOSARTAN POTASSIUM 25 MG PO TABS: 25.0000 mg | ORAL_TABLET | Freq: Every day | ORAL | 3 refills | Status: DC

## 2021-02-20 NOTE — Patient Instructions (Signed)
Please return in 3 months for blood pressure recheck and stress recheck.   I will release your lab results to you on your MyChart account with further instructions. Please reply with any questions.    Take good care of yourself and let me know if you need anything.   If you have any questions or concerns, please don't hesitate to send me a message via MyChart or call the office at [phone]. Thank you for visiting with [place] today! It's our pleasure caring for you.   Stress, Adult Stress is a normal reaction to life events. Stress is what you feel when life demands more than you are used to, or more than you think you can handle. Some stress can be useful, such as studying for a test or meeting a deadline at work. Stress that occurs too often or for too long can cause problems. Long-lasting stress is called chronic stress. Chronic stress can affect your emotional health and interfere with relationships and normal daily activities. Too much stress can weaken your body's defense system (immune system) and increase your risk for physical illness. If you already have a medical problem, stress can make it worse. What are the causes? All sorts of life events can cause stress. An event that causes stress for one person may not be stressful for someone else. Major life events, whether positive or negative, commonly cause stress. Examples include: Losing a job or starting a new job. Losing a loved one. Moving to a new town or home. Getting married or divorced. Having a baby. Getting injured or sick. Less obvious life events can also cause stress, especially if they occur day after day or in combination with each other. Examples include: Working long hours. Driving in traffic. Caring for children. Being in debt. Being in a difficult relationship. What are the signs or symptoms? Stress can cause emotional and physical symptoms and can lead to unhealthy behaviors. These include the  following: Emotional symptoms Anxiety. This is feeling worried, afraid, on edge, overwhelmed, or out of control. Anger, including irritation or impatience. Depression. This is feeling sad, down, helpless, or guilty. Trouble focusing, remembering, or making decisions. Physical symptoms Aches and pains. These may affect your head, neck, back, stomach, or other areas of your body. Tight muscles or a clenched jaw. Low energy. Trouble sleeping. Unhealthy behaviors Eating to feel better (overeating) or skipping meals. Working too much or putting off tasks. Smoking, drinking alcohol, or using drugs to feel better. How is this diagnosed? A stress disorder is diagnosed through an assessment by your health care provider. A stress disorder may be diagnosed based on: Your symptoms and any stressful life events. Your medical history. Tests to rule out other causes of your symptoms. Depending on your condition, your health care provider may refer you to a specialist for further evaluation. How is this treated? Stress management techniques are the recommended treatment for stress. Medicine is not typically recommended for treating stress. Techniques to reduce your reaction to stressful life events include: Identifying stress. Monitor yourself for symptoms of stress and notice what causes stress for you. These skills may help you to avoid or prepare for stressful events. Managing time. Set your priorities, keep a calendar of events, and learn to say no. These actions can help you avoid taking on too much. Techniques for dealing with stress include: Rethinking the problem. Try to think realistically about stressful events rather than ignoring them or overreacting. Try to find the positives in a stressful situation  rather than focusing on the negatives. Exercise. Physical exercise can release both physical and emotional tension. The key is to find a form of exercise that you enjoy and do it  regularly. Relaxation techniques. These relax the body and mind. Find one or more that you enjoy and use the techniques regularly. Examples include: Meditation, deep breathing, or progressive relaxation techniques. Yoga or tai chi. Biofeedback, mindfulness techniques, or journaling. Listening to music, being in nature, or taking part in other hobbies. Practicing a healthy lifestyle. Eat a balanced diet, drink plenty of water, limit or avoid caffeine, and get plenty of sleep. Having a strong support network. Spend time with family, friends, or other people you enjoy being around. Express your feelings and talk things over with someone you trust. Counseling or talk therapy with a mental health provider may help if you are having trouble managing stress by yourself. Follow these instructions at home: Lifestyle  Avoid drugs. Do not use any products that contain nicotine or tobacco. These products include cigarettes, chewing tobacco, and vaping devices, such as e-cigarettes. If you need help quitting, ask your health care provider. If you drink alcohol: Limit how much you have to: 0-1 drink a day for women who are not pregnant. 0-2 drinks a day for men. Know how much alcohol is in a drink. In the [place] one drink equals one 12 oz bottle of beer (355 mL), one 5 oz glass of wine (148 mL), or one 1 oz glass of hard liquor (44 mL). Do not use alcohol or drugs to relax. Eat a balanced diet that includes fresh fruits and vegetables, whole grains, lean meats, fish, eggs, beans, and low-fat dairy. Avoid processed foods and foods high in added fat, sugar, and salt. Exercise at least 30 minutes on 5 or more days each week. Get 7-8 hours of sleep each night. General instructions  Practice stress management techniques as told by your health care provider. Drink enough fluid to keep your urine pale yellow. Take over-the-counter and prescription medicines only as told by your health care provider. Keep all  follow-up visits. This is important. Contact a health care provider if: Your symptoms get worse. You have new symptoms. You feel overwhelmed by your problems and can no longer manage them by yourself. Get help right away if: You have thoughts of hurting yourself or others. Get help right awayif you feel like you may hurt yourself or others, or have thoughts about taking your own life. Go to your nearest emergency room or: Call 911. Call the [facility] [place] at [phone] or 988. This is open 24 hours a day. Text the Crisis Text Line at 708 205 8497. Summary Stress is a normal reaction to life events. It can cause problems if it happens too often or for too long. Practicing stress management techniques is the best way to treat stress. Counseling or talk therapy with a mental health provider may help if you are having trouble managing stress by yourself. This information is not intended to replace advice given to you by your health care provider. Make sure you discuss any questions you have with your health care provider. Document Revised: 08/11/2020 Document Reviewed: 08/11/2020 Elsevier Patient Education  [place].
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
## 2021-03-27 ENCOUNTER — Other Ambulatory Visit: Payer: Self-pay

## 2021-03-27 ENCOUNTER — Other Ambulatory Visit: Payer: BC Managed Care – PPO

## 2021-03-27 ENCOUNTER — Encounter: Payer: Self-pay | Admitting: Internal Medicine

## 2021-03-27 DIAGNOSIS — E782 Mixed hyperlipidemia: Secondary | ICD-10-CM

## 2021-03-27 LAB — LIPID PANEL
Chol/HDL Ratio: 2.6 ratio (ref 0.0–5.0)
Cholesterol, Total: 213 mg/dL — ABNORMAL HIGH (ref 100–199)
HDL: 82 mg/dL (ref >39)
LDL Chol Calc (NIH): 85 mg/dL (ref 0–99)
Triglycerides: 283 mg/dL — ABNORMAL HIGH (ref 0–149)
VLDL Cholesterol Cal: 46 mg/dL — ABNORMAL HIGH (ref 5–40)

## 2021-03-27 LAB — ALT: ALT: 31 IU/L (ref 0–44)

## 2021-04-13 ENCOUNTER — Emergency Department (HOSPITAL_COMMUNITY)
Admission: EM | Admit: 2021-04-13 | Discharge: 2021-04-13 | Disposition: A | Discharge status: Home / Self Care | Payer: BC Managed Care – PPO | Attending: Emergency Medicine | Admitting: Emergency Medicine

## 2021-04-13 ENCOUNTER — Encounter (HOSPITAL_COMMUNITY): Payer: Self-pay

## 2021-04-13 ENCOUNTER — Telehealth: Payer: Self-pay

## 2021-04-13 ENCOUNTER — Emergency Department (HOSPITAL_COMMUNITY): Payer: BC Managed Care – PPO

## 2021-04-13 DIAGNOSIS — R42 Dizziness and giddiness: Secondary | ICD-10-CM | POA: Insufficient documentation

## 2021-04-13 DIAGNOSIS — I10 Essential (primary) hypertension: Secondary | ICD-10-CM | POA: Diagnosis not present

## 2021-04-13 DIAGNOSIS — R079 Chest pain, unspecified: Secondary | ICD-10-CM | POA: Diagnosis not present

## 2021-04-13 DIAGNOSIS — M25512 Pain in left shoulder: Secondary | ICD-10-CM | POA: Diagnosis not present

## 2021-04-13 DIAGNOSIS — R0602 Shortness of breath: Secondary | ICD-10-CM | POA: Diagnosis not present

## 2021-04-13 DIAGNOSIS — R0789 Other chest pain: Secondary | ICD-10-CM | POA: Diagnosis not present

## 2021-04-13 DIAGNOSIS — R200 Anesthesia of skin: Secondary | ICD-10-CM | POA: Insufficient documentation

## 2021-04-13 DIAGNOSIS — Z79899 Other long term (current) drug therapy: Secondary | ICD-10-CM | POA: Insufficient documentation

## 2021-04-13 DIAGNOSIS — R072 Precordial pain: Secondary | ICD-10-CM | POA: Insufficient documentation

## 2021-04-13 LAB — CBC WITH DIFFERENTIAL/PLATELET
Abs Immature Granulocytes: 0.02 10*3/uL (ref 0.00–0.07)
Basophils Absolute: 0.1 10*3/uL (ref 0.0–0.1)
Basophils Relative: 1 %
Eosinophils Absolute: 0.3 10*3/uL (ref 0.0–0.5)
Eosinophils Relative: 7 %
HCT: 40.2 % (ref 39.0–52.0)
Hemoglobin: 13.5 g/dL (ref 13.0–17.0)
Immature Granulocytes: 0 %
Lymphocytes Relative: 34 %
Lymphs Abs: 1.7 10*3/uL (ref 0.7–4.0)
MCH: 31.1 pg (ref 26.0–34.0)
MCHC: 33.6 g/dL (ref 30.0–36.0)
MCV: 92.6 fL (ref 80.0–100.0)
Monocytes Absolute: 0.5 10*3/uL (ref 0.1–1.0)
Monocytes Relative: 9 %
Neutro Abs: 2.5 10*3/uL (ref 1.7–7.7)
Neutrophils Relative %: 49 %
Platelets: 285 10*3/uL (ref 150–400)
RBC: 4.34 MIL/uL (ref 4.22–5.81)
RDW: 11.4 % — ABNORMAL LOW (ref 11.5–15.5)
WBC: 5 10*3/uL (ref 4.0–10.5)
nRBC: 0 % (ref 0.0–0.2)

## 2021-04-13 LAB — COMPREHENSIVE METABOLIC PANEL
ALT: 36 U/L (ref 0–44)
AST: 41 U/L (ref 15–41)
Albumin: 4.3 g/dL (ref 3.5–5.0)
Alkaline Phosphatase: 64 U/L (ref 38–126)
Anion gap: 7 (ref 5–15)
BUN: 15 mg/dL (ref 6–20)
CO2: 24 mmol/L (ref 22–32)
Calcium: 9.1 mg/dL (ref 8.9–10.3)
Chloride: 103 mmol/L (ref 98–111)
Creatinine, Ser: 0.97 mg/dL (ref 0.61–1.24)
GFR, Estimated: >60 mL/min (ref >60)
Glucose, Bld: 108 mg/dL — ABNORMAL HIGH (ref 70–99)
Potassium: 4.1 mmol/L (ref 3.5–5.1)
Sodium: 134 mmol/L — ABNORMAL LOW (ref 135–145)
Total Bilirubin: 1.6 mg/dL — ABNORMAL HIGH (ref 0.3–1.2)
Total Protein: 7.9 g/dL (ref 6.5–8.1)

## 2021-04-13 LAB — D-DIMER, QUANTITATIVE: D-Dimer, Quant: <0.27 ug/mL-FEU (ref 0.00–0.50)

## 2021-04-13 LAB — TROPONIN I (HIGH SENSITIVITY)
Troponin I (High Sensitivity): 5 ng/L (ref <18)
Troponin I (High Sensitivity): 5 ng/L (ref <18)

## 2021-04-13 MED ORDER — ASPIRIN 81 MG PO CHEW
324.0000 mg | CHEWABLE_TABLET | Freq: Once | ORAL | Status: AC
  Administered 2021-04-13: 324 mg via ORAL
  Filled 2021-04-13: qty 4

## 2021-04-13 MED ORDER — LOSARTAN POTASSIUM 25 MG PO TABS
25.0000 mg | ORAL_TABLET | Freq: Once | ORAL | Status: AC
  Administered 2021-04-13: 25 mg via ORAL
  Filled 2021-04-13: qty 1

## 2021-04-13 NOTE — Telephone Encounter (Signed)
FYI

## 2021-04-13 NOTE — Telephone Encounter (Signed)
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 [name] Environmental health practitioner) transferred patient to join the call. Yesterday when he was working, he felt a stabbing sensation in his chest and he sat down. He would like to speak with a nurse. Translation No Nurse Assessment Nurse: Lorin Picket, RN, Elnita Maxwell Date/Time Lamount Cohen Time): 04/13/2021 2:17:24 PM Confirm and document reason for call. If symptomatic, describe symptoms. ---[name] states he has numbness in L arm that started an hour ago and went away, and L side of jaw feels numb and it just started, yesterday when he was working he had stabbing chest pain that lasted, and he sat down and pain went away, pain lasted throughout the day, no CP now, denies other symptoms, Does the patient have any new or worsening symptoms? ---Yes Will a triage be completed? ---Yes Related visit to physician within the last 2 weeks? ---No Does the PT have any chronic conditions? (i.e. diabetes, asthma, this includes High risk factors for pregnancy, etc.) ---Yes List chronic conditions. ---HTN Is this a behavioral health or substance abuse call? ---No Guidelines Guideline Title Affirmed Question Affirmed Notes Nurse Date/Time Lamount Cohen Time) Neurologic Deficit [1] Numbness (i.e., loss of sensation) of Lorin Picket, RNElnita Maxwell  04/13/2021 2:22:12   Guidelines Guideline Title Affirmed Question Affirmed Notes Nurse Date/Time Lamount Cohen Time) the face, arm / hand, or leg / foot on one side of the body AND [2] sudden onset AND [3] present now Disp. Time Lamount Cohen Time) Disposition Final User 04/13/2021 2:24:15 PM 911 Outcome Documentation Scott, RN, Elnita Maxwell Reason: refused, someone is going to drive him to ER 2/50/0370 2:23:49 PM Call EMS 911 Now Yes Lorin Picket, RN, Elizabeth Sauer Disagree/Comply Disagree [name] Understands Yes PreDisposition Call Doctor Care Advice Given Per Guideline CALL EMS 911 NOW: * Immediate medical attention is needed. You need to hang up and call 911 (or an ambulance). * Triager Discretion: I'll call you back in a few minutes to be sure you were able to reach them. CARE ADVICE given per Neurologic Deficit (Adult) guideline. Comments User: Dorris Fetch, RN Date/Time Lamount Cohen Time): 04/13/2021 2:24:34 PM Refused to call 911, is having someone drive him to ER Referrals GO TO FACILITY UNDECIDED

## 2021-04-13 NOTE — ED Provider Notes (Signed)
?[facility]-EMERGENCY DEPT ?Provider Note ? ? ?CSN: [id number] ?Arrival date & time: 04/13/21  1432 ? ?  ? ?History ? ?Chief Complaint  ?Patient presents with  ? Chest Pain  ? ? ?Collin Kaiser is a 46 y.o. male with past medical history of hypertension, mixed hyperlipidemia, alcohol use, substance use, generalized anxiety disorder.  Patient is by Dr.Chandrasekhar with cardiology.  Presents to the emergency department with a complaint of chest pain and lightheadedness. ? ?Patient states that yesterday approximately 9:30 AM while standing at work he had a sudden onset of left-sided chest pain.  Chest pain lasted approximately 1 minute.  Patient was described as a stabbing pain and "something moved inside me, felt like air was coming out."  Patient states that he had associated lightheadedness and shortness of breath.  Patient sat down and lightheadedness and shortness of breath resolved.  Patient states that he was intermittently lightheaded throughout the rest of the day.  Patient states that when he got home he checked his blood pressure was elevated at 180/97.  Patient reports that he does not do his losartan medication yesterday however he did not take that medication today due to running out. ? ?Patient states that today he woke up at 6 AM with pain to his left shoulder.  Pain was consistent throughout the morning.  Patient states approximately 2 hours prior he was walking down the hallway at work when he had a worsening of his left shoulder pain and felt numbness to his left jaw.  Patient reports that he felt lightheaded no shortness of breath at this time.  Patient denies any associated chest pain during this incident.  Patient states that the numbness and shoulder pain have resolved. ? ?Patient denies any fever, chills, palpitations, leg swelling or tenderness, or syncope, nausea, vomiting, diaphoresis, abdominal pain. ? ? ?Chest Pain ?Associated symptoms: numbness and shortness of breath    ?Associated symptoms: no abdominal pain, no back pain, no dizziness, no fever, no headache, no nausea, no palpitations, no vomiting and no weakness   ? ?  ? ?Home Medications ?Prior to Admission medications   ?Medication Sig Start Date End Date Taking? Authorizing Provider  ?albuterol (VENTOLIN HFA) 108 (90 Base) MCG/ACT inhaler Inhale 1-2 puffs into the lungs every 6 (six) hours as needed for wheezing or shortness of breath. 12/21/19   Orland MustardWolfe, Allison, MD  ?EPINEPHrine 0.3 mg/0.3 mL IJ SOAJ injection Inject 0.3 mg into the muscle once as needed. Allergic reaction 05/03/20   Eulis FosterWebb, Padonda B, FNP  ?losartan (COZAAR) 25 MG tablet Take 1 tablet (25 mg total) by mouth daily. 02/20/21   Willow OraAndy, Camille L, MD  ?multivitamin (ONE-A-DAY MEN'S) TABS tablet Take 1 tablet by mouth daily.    [provider]  ?naltrexone (DEPADE) 50 MG tablet Take 1 tablet (50 mg total) by mouth daily. 02/15/20   Orland MustardWolfe, Allison, MD  ?rosuvastatin (CRESTOR) 5 MG tablet Take 1 tablet (5 mg total) by mouth daily. 12/23/20 03/23/21  Christell Constanthandrasekhar, Mahesh A, MD  ?   ? ?Allergies    ?Shellfish allergy, Lactose, and Tamiflu [oseltamivir phosphate]   ? ?Review of Systems   ?Review of Systems  ?Constitutional:  Negative for chills and fever.  ?Eyes:  Negative for visual disturbance.  ?Respiratory:  Positive for shortness of breath.   ?Cardiovascular:  Positive for chest pain. Negative for palpitations and leg swelling.  ?Gastrointestinal:  Negative for abdominal pain, nausea and vomiting.  ?Musculoskeletal:  Negative for back pain and neck pain.  ?  Skin:  Negative for color change and rash.  ?Neurological:  Positive for light-headedness and numbness. Negative for dizziness, tremors, seizures, syncope, facial asymmetry, speech difficulty, weakness and headaches.  ?Psychiatric/Behavioral:  Negative for confusion.   ? ?Physical Exam ?Updated Vital Signs ?BP (!) 147/90   Pulse 90   Temp 98 ?F (36.7 ?C) (Oral)   Resp 20   SpO2 98%  ?Physical Exam ?Vitals  and nursing note reviewed.  ?Constitutional:   ?   General: He is not in acute distress. ?   Appearance: He is not ill-appearing, toxic-appearing or diaphoretic.  ?HENT:  ?   Head: Normocephalic.  ?Eyes:  ?   General: No scleral icterus.    ?   Right eye: No discharge.     ?   Left eye: No discharge.  ?Cardiovascular:  ?   Rate and Rhythm: Normal rate.  ?   Pulses:     ?     Radial pulses are 2+ on the right side and 2+ on the left side.  ?   Heart sounds: Normal heart sounds, S1 normal and S2 normal. No murmur heard. ?Pulmonary:  ?   Effort: Pulmonary effort is normal. No tachypnea, bradypnea or respiratory distress.  ?   Breath sounds: Normal breath sounds. No stridor.  ?Abdominal:  ?   General: Abdomen is protuberant. There is no distension. There are no signs of injury.  ?   Palpations: Abdomen is soft. There is no mass or pulsatile mass.  ?   Tenderness: There is no abdominal tenderness. There is no guarding or rebound.  ?   Hernia: There is no hernia in the umbilical area or ventral area.  ?Musculoskeletal:  ?   Cervical back: Neck supple.  ?   Right lower leg: No swelling, deformity, lacerations, tenderness or bony tenderness. No edema.  ?   Left lower leg: No swelling, deformity, lacerations, tenderness or bony tenderness. No edema.  ?Skin: ?   General: Skin is warm and dry.  ?Neurological:  ?   General: No focal deficit present.  ?   Mental Status: He is alert.  ?Psychiatric:     ?   Behavior: Behavior is cooperative.  ? ? ?ED Results / Procedures / Treatments   ?Labs ?(all labs ordered are listed, but only abnormal results are displayed) ?Labs Reviewed  ?COMPREHENSIVE METABOLIC PANEL - Abnormal; Notable for the following components:  ?    Result Value  ? Sodium 134 (*)   ? Glucose, Bld 108 (*)   ? Total Bilirubin 1.6 (*)   ? All other components within normal limits  ?CBC WITH DIFFERENTIAL/PLATELET - Abnormal; Notable for the following components:  ? RDW 11.4 (*)   ? All other components within normal  limits  ?D-DIMER, QUANTITATIVE  ?TROPONIN I (HIGH SENSITIVITY)  ?TROPONIN I (HIGH SENSITIVITY)  ? ? ?EKG ?EKG Interpretation ? ?Date/Time:  Thursday April 13 2021 14:41:10 EDT ?Ventricular Rate:  83 ?PR Interval:  145 ?QRS Duration: 74 ?QT Interval:  352 ?QTC Calculation: 414 ?R Axis:   58 ?Text Interpretation: Sinus rhythm Minimal ST depression, inferior leads No significant change since last tracing Confirmed by Lorre Nick ([id number]) on 04/13/2021 7:41:14 PM ? ?Radiology ?DG Chest 2 View ? ?Result Date: 04/13/2021 ?CLINICAL DATA:  Chest pain EXAM: CHEST - 2 VIEW COMPARISON:  01/22/2021 FINDINGS: The heart size and mediastinal contours are within normal limits. Both lungs are clear. The visualized skeletal structures are unremarkable. IMPRESSION: No active cardiopulmonary disease. Electronically Signed  By: Marlan Palau M.D.   On: 04/13/2021 16:03   ? ?Procedures ?Procedures  ? ? ?Medications Ordered in ED ?Medications  ?aspirin chewable tablet 324 mg (324 mg Oral Given 04/13/21 1531)  ?losartan (COZAAR) tablet 25 mg (25 mg Oral Given 04/13/21 1536)  ? ? ?ED Course/ Medical Decision Making/ A&P ?  ?                        ?Medical Decision Making ?Amount and/or Complexity of Data Reviewed ?Labs: ordered. ?Radiology: ordered. ? ?Risk ?OTC drugs. ?Prescription drug management. ? ? ?Alert 46 year old male in no distress, nontoxic-appearing.  Presents emergency department with a chief complaint of chest pain and lightheadedness. ? ?Information obtained from patient.  Past medical records were reviewed including previous provider notes, labs, and imaging.  Patient's past medical history as outlined in HPI which complicates care. ? ?Per chart review patient had stress testing 3/15/2 2 which showed no ischemia on the stress EKG. ? ?Due to patient's reports of chest pain, lightheadedness, and general numbness concern for possible ACS.  Patient given 325 mg of aspirin.  ACS work-up initiated.  Due to reports of chest pain,  shortness of breath, and lightheadedness will obtain D-dimer as well. ? ?I personally viewed and tracings EKG. ?Tracing shows sinus rhythm with minimal ST changes.  Tracing appears similar to previous tracings.

## 2021-04-13 NOTE — ED Provider Notes (Signed)
I provided a substantive portion of the care of this patient.  I personally performed the entirety of the medical decision making for this encounter. ? ?EKG Interpretation ? ?Date/Time:  Thursday April 13 2021 14:41:10 EDT ?Ventricular Rate:  83 ?PR Interval:  145 ?QRS Duration: 74 ?QT Interval:  352 ?QTC Calculation: 414 ?R Axis:   58 ?Text Interpretation: Sinus rhythm Minimal ST depression, inferior leads No significant change since last tracing Confirmed by Lorre Nick ([id number]) on 04/13/2021 7:41:69 PM  ? ?46 year old male presents with intermittent left sharp chest discomfort lasting for few seconds.  He is also had hypertension as well 2.  Negative stress test a year ago.  Work-up here including troponin and D-dimers were negative.  Will have patient follow-up with his cardiologist ?  ?Lorre Nick, MD ?04/13/21 2026 ? ?

## 2021-04-13 NOTE — Discharge Instructions (Addendum)
[facility] today to be evaluated for your symptoms.  Your physical exam, lab work, EKG, and chest x-ray were reassuring.  We have a low suspicion that you are having an acute heart attack today.  Your blood pressure was found to be elevated while in the emergency department.  Please follow-up with your primary care doctor for further management of your blood pressure.  Due to your chest pain please follow-up with your cardiologist for repeat evaluation. ? ?Get help right away if: ?Your chest pain gets worse. ?You have a cough that gets worse, or you cough up blood. ?You have severe pain in your abdomen. ?You faint. ?You have sudden, unexplained chest discomfort. ?You have sudden, unexplained discomfort in your arms, back, neck, or jaw. ?You have shortness of breath at any time. ?You suddenly start to sweat, or your skin gets clammy. ?You feel nausea or you vomit. ?You suddenly feel lightheaded or dizzy. ?You have severe weakness, or unexplained weakness or fatigue. ?Your heart begins to beat quickly, or it feels like it is skipping beats. ?

## 2021-04-13 NOTE — ED Triage Notes (Signed)
Pt reports left sided chest pain yesterday X2 minutes. Described as stabbing. After he felt dizzy and off balance. Today patient had numbness and tingling in left jaw and fingertips.  ? ?Pt reports his BP has been elevated that past 2 days at 160's-170's.  ? ?Reports low back pain.  ? ?A/Ox4 ?Ambulatory in triage.  ? ?Denies N/V  ?

## 2021-05-09 ENCOUNTER — Telehealth: Payer: Self-pay | Admitting: Internal Medicine

## 2021-05-09 NOTE — Telephone Encounter (Signed)
Lenna Sciara  is the patient's Nurse Case Manager with [employer]. She wanted to provide this information to DR. Chandrasekar and his team in case the office wanted to reach out to her to discuss the patient's care. Her direct number is  [phone] ?

## 2021-05-17 ENCOUNTER — Other Ambulatory Visit: Payer: Self-pay

## 2021-05-17 ENCOUNTER — Other Ambulatory Visit (HOSPITAL_BASED_OUTPATIENT_CLINIC_OR_DEPARTMENT_OTHER): Payer: Self-pay

## 2021-05-17 ENCOUNTER — Emergency Department (HOSPITAL_BASED_OUTPATIENT_CLINIC_OR_DEPARTMENT_OTHER)
Admission: EM | Admit: 2021-05-17 | Discharge: 2021-05-17 | Disposition: A | Discharge status: Home / Self Care | Payer: BC Managed Care – PPO | Attending: Emergency Medicine | Admitting: Emergency Medicine

## 2021-05-17 DIAGNOSIS — M25551 Pain in right hip: Secondary | ICD-10-CM | POA: Diagnosis not present

## 2021-05-17 DIAGNOSIS — M549 Dorsalgia, unspecified: Secondary | ICD-10-CM | POA: Diagnosis not present

## 2021-05-17 DIAGNOSIS — M545 Low back pain, unspecified: Secondary | ICD-10-CM | POA: Diagnosis not present

## 2021-05-17 DIAGNOSIS — M62830 Muscle spasm of back: Secondary | ICD-10-CM

## 2021-05-17 DIAGNOSIS — M62838 Other muscle spasm: Secondary | ICD-10-CM | POA: Insufficient documentation

## 2021-05-17 DIAGNOSIS — I959 Hypotension, unspecified: Secondary | ICD-10-CM | POA: Diagnosis not present

## 2021-05-17 MED ORDER — CYCLOBENZAPRINE HCL 10 MG PO TABS
10.0000 mg | ORAL_TABLET | Freq: Three times a day (TID) | ORAL | 0 refills | Status: DC | PRN
  Filled 2021-05-17: qty 15, 5d supply, fill #0

## 2021-05-17 MED ORDER — NAPROXEN 375 MG PO TABS
375.0000 mg | ORAL_TABLET | Freq: Two times a day (BID) | ORAL | 0 refills | Status: DC
  Filled 2021-05-17: qty 20, 10d supply, fill #0

## 2021-05-17 MED ORDER — OXYCODONE-ACETAMINOPHEN 5-325 MG PO TABS
ORAL_TABLET | ORAL | 0 refills | Status: DC
Start: 2021-05-17 — End: 2021-05-23
  Filled 2021-05-17: qty 12, 3d supply, fill #0

## 2021-05-17 MED ORDER — OXYCODONE-ACETAMINOPHEN 5-325 MG PO TABS
1.0000 | ORAL_TABLET | Freq: Four times a day (QID) | ORAL | 0 refills | Status: DC | PRN
Start: 2021-05-17 — End: 2021-05-23

## 2021-05-17 MED ORDER — HYDROMORPHONE HCL 1 MG/ML IJ SOLN
1.0000 mg | Freq: Once | INTRAMUSCULAR | Status: AC
  Administered 2021-05-17: 1 mg via INTRAVENOUS
  Filled 2021-05-17: qty 1

## 2021-05-17 MED ORDER — KETOROLAC TROMETHAMINE 30 MG/ML IJ SOLN
30.0000 mg | Freq: Once | INTRAMUSCULAR | Status: AC
  Administered 2021-05-17: 30 mg via INTRAVENOUS
  Filled 2021-05-17: qty 1

## 2021-05-17 NOTE — ED Provider Notes (Signed)
?[facility] EMERGENCY DEPT ?Provider Note ? ? ?CSN: [id number] ?Arrival date & time: 05/17/21  5361 ? ?  ? ?History ? ?Chief Complaint  ?Patient presents with  ? Back Pain  ? ? ?Collin Kaiser is a 46 y.o. male.  He was brought in by ambulance today for severe back spasms.  He has been troubled by back pain for 3 weeks after lifting some heavy boxes.  He said he went to a chiropractor and has been doing some stretching exercises.  Today when he bent over he experienced severe spasms that caused him to fall to the floor.  It travels throughout his low back and down both of his thighs.  He sometimes has some numbness in his right thigh.  No bowel or bladder incontinence.  He denies any IV drug use no fevers or chills ? ?The history is provided by the patient.  ?Back Pain ?Location:  Lumbar spine and gluteal region ?Quality:  Stabbing ?Radiates to:  L thigh and R thigh ?Pain severity:  Severe ?Onset quality:  Sudden ?Duration:  2 hours ?Timing:  Intermittent ?Progression:  Unchanged ?Chronicity:  New ?Context: lifting heavy objects and recent injury   ?Relieved by:  Nothing ?Worsened by:  Movement ?Ineffective treatments:  Lying down ?Associated symptoms: no abdominal pain, no bladder incontinence, no bowel incontinence, no chest pain, no dysuria and no fever   ?Risk factors: no hx of cancer and no vascular disease   ? ?  ? ?Home Medications ?Prior to Admission medications   ?Medication Sig Start Date End Date Taking? Authorizing Provider  ?albuterol (VENTOLIN HFA) 108 (90 Base) MCG/ACT inhaler Inhale 1-2 puffs into the lungs every 6 (six) hours as needed for wheezing or shortness of breath. 12/21/19   Orland Mustard, MD  ?EPINEPHrine 0.3 mg/0.3 mL IJ SOAJ injection Inject 0.3 mg into the muscle once as needed. Allergic reaction 05/03/20   Eulis Foster, FNP  ?losartan (COZAAR) 25 MG tablet Take 1 tablet (25 mg total) by mouth daily. 02/20/21   Willow Ora, MD  ?multivitamin (ONE-A-DAY MEN'S) TABS tablet  Take 1 tablet by mouth daily.    [provider]  ?naltrexone (DEPADE) 50 MG tablet Take 1 tablet (50 mg total) by mouth daily. 02/15/20   Orland Mustard, MD  ?rosuvastatin (CRESTOR) 5 MG tablet Take 1 tablet (5 mg total) by mouth daily. 12/23/20 03/23/21  Christell Constant, MD  ?   ? ?Allergies    ?Shellfish allergy, Lactose, and Tamiflu [oseltamivir phosphate]   ? ?Review of Systems   ?Review of Systems  ?Constitutional:  Negative for fever.  ?HENT:  Negative for sore throat.   ?Respiratory:  Negative for shortness of breath.   ?Cardiovascular:  Negative for chest pain.  ?Gastrointestinal:  Negative for abdominal pain and bowel incontinence.  ?Genitourinary:  Negative for bladder incontinence and dysuria.  ?Musculoskeletal:  Positive for back pain.  ?Skin:  Negative for rash.  ? ?Physical Exam ?Updated Vital Signs ?BP 134/89   Pulse 86   Temp 98.2 ?F (36.8 ?C) (Oral)   Resp 17   Ht 5\' 8"  (1.727 m)   Wt 94 kg   SpO2 98%   BMI 31.51 kg/m?  ?Physical Exam ?Vitals and nursing note reviewed.  ?Constitutional:   ?   General: He is not in acute distress. ?   Appearance: Normal appearance. He is well-developed.  ?HENT:  ?   Head: Normocephalic and atraumatic.  ?Eyes:  ?   Conjunctiva/sclera: Conjunctivae normal.  ?Cardiovascular:  ?  Rate and Rhythm: Normal rate and regular rhythm.  ?   Heart sounds: No murmur heard. ?Pulmonary:  ?   Effort: Pulmonary effort is normal. No respiratory distress.  ?   Breath sounds: Normal breath sounds.  ?Abdominal:  ?   Palpations: Abdomen is soft.  ?   Tenderness: There is no abdominal tenderness. There is no guarding or rebound.  ?Musculoskeletal:     ?   General: No swelling.  ?   Cervical back: Neck supple.  ?   Right lower leg: No edema.  ?   Left lower leg: No edema.  ?Skin: ?   General: Skin is warm and dry.  ?   Capillary Refill: Capillary refill takes less than 2 seconds.  ?Neurological:  ?   General: No focal deficit present.  ?   Mental Status: He is alert.  ?    Sensory: No sensory deficit.  ?   Motor: No weakness.  ?Psychiatric:     ?   Mood and Affect: Mood normal.  ? ? ?ED Results / Procedures / Treatments   ?Labs ?(all labs ordered are listed, but only abnormal results are displayed) ?Labs Reviewed - No data to display ? ?EKG ?None ? ?Radiology ?No results found. ? ?Procedures ?Procedures  ? ? ?Medications Ordered in ED ?Medications  ?HYDROmorphone (DILAUDID) injection 1 mg (has no administration in time range)  ? ? ?ED Course/ Medical Decision Making/ A&P ?Clinical Course as of 05/18/21 0845  ?Wed May 17, 2021  ?0811 Reassessed patient, pain much better after medication.  He is going to call for a ride. [MB]  ?  ?Clinical Course User Index ?[MB] Terrilee Files, MD  ? ?                        ?Medical Decision Making ?Risk ?Prescription drug management. ? ? ?Differential diagnosis includes musculoskeletal pain fracture contusion, sciatica, disc disease.  No indications for imaging as this appears to have no red flags.  Patient's pain adequately controlled.  No indications for admission or further work-up at this time.  Return instructions discussed ? ? ? ? ? ? ? ?Final Clinical Impression(s) / ED Diagnoses ?Final diagnoses:  ?Back muscle spasm  ? ? ?Rx / DC Orders ?ED Discharge Orders   ? ?      Ordered  ?  cyclobenzaprine (FLEXERIL) 10 MG tablet  3 times daily PRN       ? 05/17/21 0824  ?  naproxen (NAPROSYN) 375 MG tablet  2 times daily       ? 05/17/21 0824  ?  oxyCODONE-acetaminophen (PERCOCET/ROXICET) 5-325 MG tablet  Every 6 hours PRN       ? 05/17/21 0825  ? ?  ?  ? ?  ? ? ?  ?Terrilee Files, MD ?05/18/21 [phone] ? ?

## 2021-05-17 NOTE — ED Triage Notes (Signed)
BIBA, pt c/o lower back pain and right sided hip pain that radiates down leg, pt sts lifted cases 3 wks ago and followed up with PT, symptoms improved but came back worse this morning ?

## 2021-05-18 DIAGNOSIS — R0789 Other chest pain: Secondary | ICD-10-CM | POA: Diagnosis not present

## 2021-05-18 DIAGNOSIS — R42 Dizziness and giddiness: Secondary | ICD-10-CM | POA: Diagnosis not present

## 2021-05-18 DIAGNOSIS — R110 Nausea: Secondary | ICD-10-CM | POA: Diagnosis not present

## 2021-05-18 DIAGNOSIS — R079 Chest pain, unspecified: Secondary | ICD-10-CM | POA: Diagnosis not present

## 2021-05-23 ENCOUNTER — Ambulatory Visit: Payer: BC Managed Care – PPO | Admitting: Family Medicine

## 2021-05-23 ENCOUNTER — Encounter: Payer: Self-pay | Admitting: Family Medicine

## 2021-05-23 VITALS — BP 126/80 | HR 88 | Temp 98.7°F | Ht 68.0 in | Wt 221.2 lb

## 2021-05-23 DIAGNOSIS — M5416 Radiculopathy, lumbar region: Secondary | ICD-10-CM | POA: Diagnosis not present

## 2021-05-23 DIAGNOSIS — I10 Essential (primary) hypertension: Secondary | ICD-10-CM

## 2021-05-23 DIAGNOSIS — F411 Generalized anxiety disorder: Secondary | ICD-10-CM | POA: Diagnosis not present

## 2021-05-23 DIAGNOSIS — S39012D Strain of muscle, fascia and tendon of lower back, subsequent encounter: Secondary | ICD-10-CM

## 2021-05-23 DIAGNOSIS — F1020 Alcohol dependence, uncomplicated: Secondary | ICD-10-CM | POA: Diagnosis not present

## 2021-05-23 MED ORDER — PREDNISONE 10 MG PO TABS: ORAL_TABLET | ORAL | 0 refills | Status: DC

## 2021-05-23 NOTE — Progress Notes (Signed)
Subjective  CC:  Chief Complaint  Patient presents with   Hospitalization Follow-up    Pt stated that he hurt his back at work and was having spasms and also he was also having chest pains.Pt stated that he feeling a lot better and taking muscle relaxers.    HPI: Collin Kaiser is a 46 y.o. male who presents to the office today to address the problems listed above in the chief complaint. Low back pain: I reviewed recent hospital ER visit on March 3.  Patient presented after 1 week of low back pain.  Date of injury was roughly February 25.  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  No acute pain but the day after had low back pain that progressed over the next several days.  On day of ER evaluation had severe sharp low back spasms.  ER visit treated with hydromorphone and sent out on oxycodone and Flexeril.  Patient had also seen chiropractor.  Since, he is doing better overall however he went into work yesterday and noted some low back pain with some radicular symptoms down right leg.  No weakness or bowel or bladder incontinence.  No new injuries.  Has had history of episodic low back pain.  No history of x-rays. Hypertension f/u: Control is good . Pt reports he is doing well.  Taking losartan He denies adverse effects from his BP medications. Compliance with medication is good.  Generalized anxiety disorder alcoholism: No longer on naltrexone drinks intermittently.  Anxiety still present.  Had stressful life event, college daughter with an unplanned pregnancy.  He did have some somatic anxiety related chest pain associated with that but that has calm down.  He has not seen a Veterinary surgeon.  He is not on mood medicines and does not want to be.  Assessment  1. Essential hypertension   2. Alcoholism (HCC)   3. GAD (generalized anxiety disorder)   4. Lumbar radicular pain   5. Strain of lumbar region, subsequent encounter      Plan   Hypertension f/u: BP control is well controlled.   Maintained on losartan 25 mg daily Hyperlipidemia f/u: Maintain on Crestor 5 mg daily Generalizes eye disorder alcoholism: Counseling done.  Patient is open to counseling.  Not interested in medications at this time.  No longer wishes to use naltrexone.  Will monitor.  He is high risk for relapse. Lumbar strain with secondary radicular pain: Check x-rays, add prednisone taper.  Stop oxycodone given history of substance abuse.  Continue with muscle relaxer and Naprosyn.  Back exercises and follow-up here if not improving.  No red flag symptoms present now. Education regarding management of these chronic disease states was given. Management strategies discussed on successive visits include dietary and exercise recommendations, goals of achieving and maintaining IBW, and lifestyle modifications aiming for adequate sleep and minimizing stressors.   Follow up: Return in about 6 months (around 11/23/2021) for follow up Hypertension.  Orders Placed This Encounter  Procedures   DG Lumbar Spine Complete   Meds ordered this encounter  Medications   predniSONE (DELTASONE) 10 MG tablet    Sig: Take 4 tabs qd x 2 days, 3 qd x 2 days, 2 qd x 2d, 1qd x 3 days    Dispense:  21 tablet    Refill:  0      BP Readings from Last 3 Encounters:  05/23/21 126/80  05/17/21 (!) 114/95  04/13/21 134/80   Wt Readings from Last 3  Encounters:  05/23/21 221 lb 3.2 oz (100.3 kg)  05/17/21 207 lb 3.7 oz (94 kg)  04/13/21 208 lb (94.3 kg)    Lab Results  Component Value Date   CHOL 213 (H) 03/27/2021   CHOL 154 02/20/2021   CHOL 233 (H) 12/20/2020   Lab Results  Component Value Date   HDL 82 03/27/2021   HDL 59.40 02/20/2021   HDL 61 12/20/2020   Lab Results  Component Value Date   LDLCALC 85 03/27/2021   LDLCALC 66 02/20/2021   LDLCALC 112 (H) 12/20/2020   Lab Results  Component Value Date   TRIG 283 (H) 03/27/2021   TRIG 140.0 02/20/2021   TRIG 348 (H) 12/20/2020   Lab Results  Component  Value Date   CHOLHDL 2.6 03/27/2021   CHOLHDL 3 02/20/2021   CHOLHDL 3.8 12/20/2020   Lab Results  Component Value Date   LDLDIRECT 74.0 02/15/2020   LDLDIRECT 95.0 06/25/2017   Lab Results  Component Value Date   CREATININE 0.97 04/13/2021   BUN 15 04/13/2021   NA 134 (L) 04/13/2021   K 4.1 04/13/2021   CL 103 04/13/2021   CO2 24 04/13/2021    The 10-year ASCVD risk score (Arnett DK, et al., 2019) is: 5.8%   Values used to calculate the score:     Age: 59 years     Sex: Male     Is Non-Hispanic African American: Yes     Diabetic: No     Tobacco smoker: No     Systolic Blood Pressure: 126 mmHg     Is BP treated: Yes     HDL Cholesterol: 82 mg/dL     Total Cholesterol: 213 mg/dL  I reviewed the patients updated PMH, FH, and SocHx.    Patient Active Problem List   Diagnosis Date Noted   Alcoholism (HCC) 02/20/2021    Priority: High   Essential hypertension 02/20/2021    Priority: High   Mixed hyperlipidemia 06/24/2017    Priority: High   History of substance abuse (HCC) 06/21/2017    Priority: High   Chronic cough 06/30/2020    Priority: Medium    GAD (generalized anxiety disorder) 02/15/2020    Priority: Medium    Bilateral carpal tunnel syndrome 11/20/2018    Priority: Medium    Varicose veins of leg with swelling, right 12/24/2019    Priority: Low    Allergies: Shellfish allergy, Lactose, and Tamiflu [oseltamivir phosphate]  Social History: Patient  reports that he has never smoked. He has never used smokeless tobacco. He reports current alcohol use of about 12.0 standard drinks per week. He reports that he does not use drugs.  Current Meds  Medication Sig   albuterol (VENTOLIN HFA) 108 (90 Base) MCG/ACT inhaler Inhale 1-2 puffs into the lungs every 6 (six) hours as needed for wheezing or shortness of breath.   cyclobenzaprine (FLEXERIL) 10 MG tablet Take 1 tablet (10 mg total) by mouth 3 (three) times daily as needed for muscle spasms.   EPINEPHrine  0.3 mg/0.3 mL IJ SOAJ injection Inject 0.3 mg into the muscle once as needed. Allergic reaction   losartan (COZAAR) 25 MG tablet Take 1 tablet (25 mg total) by mouth daily.   multivitamin (ONE-A-DAY MEN'S) TABS tablet Take 1 tablet by mouth daily.   naproxen (NAPROSYN) 375 MG tablet Take 1 tablet (375 mg total) by mouth 2 (two) times daily.   predniSONE (DELTASONE) 10 MG tablet Take 4 tabs qd x 2 days, 3  qd x 2 days, 2 qd x 2d, 1qd x 3 days   [DISCONTINUED] naltrexone (DEPADE) 50 MG tablet Take 1 tablet (50 mg total) by mouth daily.   [DISCONTINUED] oxyCODONE-acetaminophen (PERCOCET/ROXICET) 5-325 MG tablet Take 1 tablet by mouth every 6 (six) hours as needed for severe pain.   [DISCONTINUED] oxyCODONE-acetaminophen (PERCOCET/ROXICET) 5-325 MG tablet Take 1 tablet by mouth every 6 hours as needed for severe pain.    Review of Systems: Cardiovascular: negative for chest pain, palpitations, leg swelling, orthopnea Respiratory: negative for SOB, wheezing or persistent cough Gastrointestinal: negative for abdominal pain Genitourinary: negative for dysuria or gross hematuria  Objective  Vitals: BP 126/80   Pulse 88   Temp 98.7 F (37.1 C)   Ht 5\' 8"  (1.727 m)   Wt 221 lb 3.2 oz (100.3 kg)   SpO2 95%   BMI 33.63 kg/m  General: no acute distress  Psych:  Alert and oriented, normal mood and affect HEENT:  Normocephalic, atraumatic, supple neck  Cardiovascular:  RRR without murmur. no edema Respiratory:  Good breath sounds bilaterally, CTAB with normal respiratory effort Back: Nontender, full range of motion, normal gait.  Negative straight leg raise bilaterally Neurologic:   Mental status is normal Commons side effects, risks, benefits, and alternatives for medications and treatment plan prescribed today were discussed, and the patient expressed understanding of the given instructions. Patient is instructed to call or message via MyChart if he/she has any questions or concerns regarding our  treatment plan. No barriers to understanding were identified. We discussed Red Flag symptoms and signs in detail. Patient expressed understanding regarding what to do in case of urgent or emergency type symptoms.  Medication list was reconciled, printed and provided to the patient in AVS. Patient instructions and summary information was reviewed with the patient as documented in the AVS. This note was prepared with assistance of Dragon voice recognition software. Occasional wrong-word or sound-a-like substitutions may have occurred due to the inherent limitations of voice recognition software  This visit occurred during the SARS-CoV-2 public health emergency.  Safety protocols were in place, including screening questions prior to the visit, additional usage of staff PPE, and extensive cleaning of exam room while observing appropriate contact time as indicated for disinfecting solutions.

## 2021-05-23 NOTE — Patient Instructions (Signed)
Please return in 6 months for hypertension follow up.  ? ?Please go to our [facility] office to get your xrays done. You can walk in M-F between 8:30am- noon or 1pm - 5pm. Tell them you are there for xrays ordered by me. They will send me the results, then I will let you know the results with instructions.  ? ?Address: [address]  The Xray department is located in the basement.  ? ? ?If you have any questions or concerns, please don't hesitate to send me a message via MyChart or call the office at [phone]. Thank you for visiting with [place] today! It's our pleasure caring for you.  ? ?Back Exercises ?The following exercises strengthen the muscles that help to support the trunk (torso) and back. They also help to keep the lower back flexible. Doing these exercises can help to prevent or lessen existing low back pain. ?If you have back pain or discomfort, try doing these exercises 2-3 times each day or as told by your health care provider. ?As your pain improves, do them once each day, but increase the number of times that you repeat the steps for each exercise (do more repetitions). ?To prevent the recurrence of back pain, continue to do these exercises once each day or as told by your health care provider. ?Do exercises exactly as told by your health care provider and adjust them as directed. It is normal to feel mild stretching, pulling, tightness, or discomfort as you do these exercises, but you should stop right away if you feel sudden pain or your pain gets worse. ?Exercises ?Single knee to chest ?Repeat these steps 3-5 times for each leg: ?Lie on your back on a firm bed or the floor with your legs extended. ?Bring one knee to your chest. Your other leg should stay extended and in contact with the floor. ?Hold your knee in place by grabbing your knee or thigh with both hands and hold. ?Pull on your knee until you feel a gentle stretch in your lower back or buttocks. ?Hold the stretch for  10-30 seconds. ?Slowly release and straighten your leg. ? ?Pelvic tilt ?Repeat these steps 5-10 times: ?Lie on your back on a firm bed or the floor with your legs extended. ?Bend your knees so they are pointing toward the ceiling and your feet are flat on the floor. ?Tighten your lower abdominal muscles to press your lower back against the floor. This motion will tilt your pelvis so your tailbone points up toward the ceiling instead of pointing to your feet or the floor. ?With gentle tension and even breathing, hold this position for 5-10 seconds. ? ?Cat-cow ?Repeat these steps until your lower back becomes more flexible: ?Get into a hands-and-knees position on a firm bed or the floor. Keep your hands under your shoulders, and keep your knees under your hips. You may place padding under your knees for comfort. ?Let your head hang down toward your chest. Contract your abdominal muscles and point your tailbone toward the floor so your lower back becomes rounded like the back of a cat. ?Hold this position for 5 seconds. ?Slowly lift your head, let your abdominal muscles relax, and point your tailbone up toward the ceiling so your back forms a sagging arch like the back of a cow. ?Hold this position for 5 seconds. ? ?Press-ups ?Repeat these steps 5-10 times: ?Lie on your abdomen (face-down) on a firm bed or the floor. ?Place your palms near  your head, about shoulder-width apart. ?Keeping your back as relaxed as possible and keeping your hips on the floor, slowly straighten your arms to raise the top half of your body and lift your shoulders. Do not use your back muscles to raise your upper torso. You may adjust the placement of your hands to make yourself more comfortable. ?Hold this position for 5 seconds while you keep your back relaxed. ?Slowly return to lying flat on the floor. ? ?Bridges ?Repeat these steps 10 times: ?Lie on your back on a firm bed or the floor. ?Bend your knees so they are pointing toward the  ceiling and your feet are flat on the floor. Your arms should be flat at your sides, next to your body. ?Tighten your buttocks muscles and lift your buttocks off the floor until your waist is at almost the same height as your knees. You should feel the muscles working in your buttocks and the back of your thighs. If you do not feel these muscles, slide your feet 1-2 inches (2.5-5 cm) farther away from your buttocks. ?Hold this position for 3-5 seconds. ?Slowly lower your hips to the starting position, and allow your buttocks muscles to relax completely. ?If this exercise is too easy, try doing it with your arms crossed over your chest. ?Abdominal crunches ?Repeat these steps 5-10 times: ?Lie on your back on a firm bed or the floor with your legs extended. ?Bend your knees so they are pointing toward the ceiling and your feet are flat on the floor. ?Cross your arms over your chest. ?Tip your chin slightly toward your chest without bending your neck. ?Tighten your abdominal muscles and slowly raise your torso high enough to lift your shoulder blades a tiny bit off the floor. Avoid raising your torso higher than that because it can put too much stress on your lower back and does not help to strengthen your abdominal muscles. ?Slowly return to your starting position. ? ?Back lifts ?Repeat these steps 5-10 times: ?Lie on your abdomen (face-down) with your arms at your sides, and rest your forehead on the floor. ?Tighten the muscles in your legs and your buttocks. ?Slowly lift your chest off the floor while you keep your hips pressed to the floor. Keep the back of your head in line with the curve in your back. Your eyes should be looking at the floor. ?Hold this position for 3-5 seconds. ?Slowly return to your starting position. ? ?Contact a health care provider if: ?Your back pain or discomfort gets much worse when you do an exercise. ?Your worsening back pain or discomfort does not lessen within 2 hours after you  exercise. ?If you have any of these problems, stop doing these exercises right away. Do not do them again unless your health care provider says that you can. ?Get help right away if: ?You develop sudden, severe back pain. If this happens, stop doing the exercises right away. Do not do them again unless your health care provider says that you can. ?This information is not intended to replace advice given to you by your health care provider. Make sure you discuss any questions you have with your health care provider. ?Document Revised: 06/28/2020 Document Reviewed: 03/16/2020 ?Elsevier Patient Education ? [place]. ? ?

## 2021-05-24 ENCOUNTER — Ambulatory Visit (INDEPENDENT_AMBULATORY_CARE_PROVIDER_SITE_OTHER)
Admission: RE | Admit: 2021-05-24 | Discharge: 2021-05-24 | Disposition: A | Discharge status: Home / Self Care | Payer: BC Managed Care – PPO | Source: Ambulatory Visit | Attending: Family Medicine | Admitting: Family Medicine

## 2021-05-24 ENCOUNTER — Other Ambulatory Visit: Payer: Self-pay

## 2021-05-24 DIAGNOSIS — M5416 Radiculopathy, lumbar region: Secondary | ICD-10-CM | POA: Diagnosis not present

## 2021-05-24 DIAGNOSIS — S39012D Strain of muscle, fascia and tendon of lower back, subsequent encounter: Secondary | ICD-10-CM | POA: Diagnosis not present

## 2021-05-24 DIAGNOSIS — M545 Low back pain, unspecified: Secondary | ICD-10-CM | POA: Diagnosis not present

## 2021-05-24 MED ORDER — PREDNISONE 10 MG PO TABS: ORAL_TABLET | ORAL | 0 refills | Status: DC

## 2021-10-09 ENCOUNTER — Encounter: Payer: Self-pay | Admitting: *Deleted

## 2021-11-20 ENCOUNTER — Ambulatory Visit: Payer: BC Managed Care – PPO | Admitting: Family Medicine

## 2021-11-21 ENCOUNTER — Ambulatory Visit: Payer: BC Managed Care – PPO | Admitting: Family Medicine

## 2021-11-24 ENCOUNTER — Encounter: Payer: Self-pay | Admitting: *Deleted

## 2021-11-24 ENCOUNTER — Telehealth: Payer: Self-pay | Admitting: *Deleted

## 2021-11-24 NOTE — Patient Instructions (Signed)
Visit Information  Thank you for taking time to visit with me today. Please don't hesitate to contact me if I can be of assistance to you.   Following are the goals we discussed today:   Goals Addressed               This Visit's Progress     COMPLETED: No needs (pt-stated)        Care Coordination Interventions: Reviewed medications with patient and discussed adherence with all prescribed medications with no needed refills presented Reviewed scheduled/upcoming provider appointments including pending appointments with sufficient transportation  Assessed social determinant of health barriers          Please call the care guide team at [phone] if you need to cancel or reschedule your appointment.   If you are experiencing a Mental Health or Behavioral Health Crisis or need someone to talk to, please call the Suicide and Crisis Lifeline: 988  Patient verbalizes understanding of instructions and care plan provided today and agrees to view in MyChart. Active MyChart status and patient understanding of how to access instructions and care plan via MyChart confirmed with patient.     No further follow up required: no needs presented at this time  Elliot Cousin, RN Care Management Coordinator [facility] Main Office [phone]

## 2021-11-24 NOTE — Patient Outreach (Signed)
  Care Coordination   Initial Visit Note   11/24/2021 Name: Collin Kaiser MRN: [id number] DOB: July 31, 1975  Collin Kaiser is a 46 y.o. year old male who sees Willow Ora, MD for primary care. I spoke with  Shirley Muscat by phone today.  What matters to the patients health and wellness today?  No needs    Goals Addressed               This Visit's Progress     COMPLETED: No needs (pt-stated)        Care Coordination Interventions: Reviewed medications with patient and discussed adherence with all prescribed medications with no needed refills presented Reviewed scheduled/upcoming provider appointments including pending appointments with sufficient transportation  Assessed social determinant of health barriers         SDOH assessments and interventions completed:  Yes  SDOH Interventions Today    Flowsheet Row Most Recent Value  SDOH Interventions   Food Insecurity Interventions Intervention Not Indicated  Housing Interventions Intervention Not Indicated  Transportation Interventions Intervention Not Indicated  Utilities Interventions Intervention Not Indicated        Care Coordination Interventions Activated:  Yes  Care Coordination Interventions:  Yes, provided   Follow up plan: No further intervention required.   Encounter Outcome:  Pt. Visit Completed   Elliot Cousin, RN Care Management Coordinator [facility] Main Office [phone]

## 2021-11-27 ENCOUNTER — Ambulatory Visit: Payer: BC Managed Care – PPO | Admitting: Family Medicine

## 2021-11-27 ENCOUNTER — Encounter: Payer: Self-pay | Admitting: Family Medicine

## 2021-11-27 ENCOUNTER — Other Ambulatory Visit (HOSPITAL_COMMUNITY)
Admission: RE | Admit: 2021-11-27 | Discharge: 2021-11-27 | Disposition: A | Discharge status: Home / Self Care | Payer: BC Managed Care – PPO | Source: Ambulatory Visit | Attending: Family Medicine | Admitting: Family Medicine

## 2021-11-27 VITALS — BP 124/78 | HR 70 | Temp 98.2°F | Ht 68.0 in | Wt 198.8 lb

## 2021-11-27 DIAGNOSIS — E782 Mixed hyperlipidemia: Secondary | ICD-10-CM | POA: Diagnosis not present

## 2021-11-27 DIAGNOSIS — F1020 Alcohol dependence, uncomplicated: Secondary | ICD-10-CM

## 2021-11-27 DIAGNOSIS — I10 Essential (primary) hypertension: Secondary | ICD-10-CM

## 2021-11-27 DIAGNOSIS — Z113 Encounter for screening for infections with a predominantly sexual mode of transmission: Secondary | ICD-10-CM | POA: Insufficient documentation

## 2021-11-27 DIAGNOSIS — Z1211 Encounter for screening for malignant neoplasm of colon: Secondary | ICD-10-CM | POA: Diagnosis not present

## 2021-11-27 DIAGNOSIS — Z1212 Encounter for screening for malignant neoplasm of rectum: Secondary | ICD-10-CM

## 2021-11-27 DIAGNOSIS — F411 Generalized anxiety disorder: Secondary | ICD-10-CM

## 2021-11-27 MED ORDER — ROSUVASTATIN CALCIUM 5 MG PO TABS: 5.0000 mg | ORAL_TABLET | Freq: Every day | ORAL | 3 refills | Status: DC

## 2021-11-27 NOTE — Progress Notes (Signed)
Subjective  CC:  Chief Complaint  Patient presents with   Hypertension    HPI: Collin Kaiser is a 46 y.o. male who presents to the office today to address the problems listed above in the chief complaint. Hypertension f/u: Control is good . Pt reports he is doing well. taking medications as instructed, no medication side effects noted, no TIAs, no chest pain on exertion, no dyspnea on exertion, no swelling of ankles. Has lost about 20 pounds! Eating better. He denies adverse effects from his BP medications. Compliance with medication is good. On losartan 25 daily. Anxiety and alcohol use: he reports his mood is much better. No longer feeling overwhelmed or having panic sxs. No more somatic sxs. Still drinking but it is "controlled'.  Reports unprotected sex with new partner. Would like std screen. No sxs.  HLD On statin and well controlled. Tolerates well. Needs refill.  HM: eligible for colonoscopy.   Assessment  1. Essential hypertension   2. Mixed hyperlipidemia   3. Screening for colorectal cancer   4. Screen for STD (sexually transmitted disease)   5. Alcoholism (HCC)   6. GAD (generalized anxiety disorder)      Plan   Hypertension f/u: BP control is well controlled. On low dose ARB: if continues to lose weight and exercise and eat well, may be able to stop medication. HLD: refilled crestor 5 Alcoholism/GAD: reports stable. Discussed risk of relapse.  Screen for STD Refer for colonoscopy  Education regarding management of these chronic disease states was given. Management strategies discussed on successive visits include dietary and exercise recommendations, goals of achieving and maintaining IBW, and lifestyle modifications aiming for adequate sleep and minimizing stressors.   Follow up: feb for cpe  Orders Placed This Encounter  Procedures   HIV Antibody (routine testing w rflx)   RPR   Ambulatory referral to Gastroenterology   Meds ordered this encounter   Medications   rosuvastatin (CRESTOR) 5 MG tablet    Sig: Take 1 tablet (5 mg total) by mouth daily.    Dispense:  90 tablet    Refill:  3      BP Readings from Last 3 Encounters:  11/27/21 124/78  05/23/21 126/80  05/17/21 (!) 114/95   Wt Readings from Last 3 Encounters:  11/27/21 198 lb 12.8 oz (90.2 kg)  05/23/21 221 lb 3.2 oz (100.3 kg)  05/17/21 207 lb 3.7 oz (94 kg)    Lab Results  Component Value Date   CHOL 213 (H) 03/27/2021   CHOL 154 02/20/2021   CHOL 233 (H) 12/20/2020   Lab Results  Component Value Date   HDL 82 03/27/2021   HDL 59.40 02/20/2021   HDL 61 12/20/2020   Lab Results  Component Value Date   LDLCALC 85 03/27/2021   LDLCALC 66 02/20/2021   LDLCALC 112 (H) 12/20/2020   Lab Results  Component Value Date   TRIG 283 (H) 03/27/2021   TRIG 140.0 02/20/2021   TRIG 348 (H) 12/20/2020   Lab Results  Component Value Date   CHOLHDL 2.6 03/27/2021   CHOLHDL 3 02/20/2021   CHOLHDL 3.8 12/20/2020   Lab Results  Component Value Date   LDLDIRECT 74.0 02/15/2020   LDLDIRECT 95.0 06/25/2017   Lab Results  Component Value Date   CREATININE 0.97 04/13/2021   BUN 15 04/13/2021   NA 134 (L) 04/13/2021   K 4.1 04/13/2021   CL 103 04/13/2021   CO2 24 04/13/2021    The 10-year ASCVD  risk score (Arnett DK, et al., 2019) is: 5.7%   Values used to calculate the score:     Age: 51 years     Sex: Male     Is Non-Hispanic African American: Yes     Diabetic: No     Tobacco smoker: No     Systolic Blood Pressure: 124 mmHg     Is BP treated: Yes     HDL Cholesterol: 82 mg/dL     Total Cholesterol: 213 mg/dL  I reviewed the patients updated PMH, FH, and SocHx.    Patient Active Problem List   Diagnosis Date Noted   Alcoholism (HCC) 02/20/2021    Priority: High   Essential hypertension 02/20/2021    Priority: High   Mixed hyperlipidemia 06/24/2017    Priority: High   History of substance abuse (HCC) 06/21/2017    Priority: High   Chronic  cough 06/30/2020    Priority: Medium    GAD (generalized anxiety disorder) 02/15/2020    Priority: Medium    Bilateral carpal tunnel syndrome 11/20/2018    Priority: Medium    Varicose veins of leg with swelling, right 12/24/2019    Priority: Low    Allergies: Shellfish allergy, Lactose, and Tamiflu [oseltamivir phosphate]  Social History: Patient  reports that he has never smoked. He has never used smokeless tobacco. He reports current alcohol use of about 12.0 standard drinks of alcohol per week. He reports that he does not use drugs.  Current Meds  Medication Sig   albuterol (VENTOLIN HFA) 108 (90 Base) MCG/ACT inhaler Inhale 1-2 puffs into the lungs every 6 (six) hours as needed for wheezing or shortness of breath.   cyclobenzaprine (FLEXERIL) 10 MG tablet Take 1 tablet (10 mg total) by mouth 3 (three) times daily as needed for muscle spasms.   EPINEPHrine 0.3 mg/0.3 mL IJ SOAJ injection Inject 0.3 mg into the muscle once as needed. Allergic reaction   losartan (COZAAR) 25 MG tablet Take 1 tablet (25 mg total) by mouth daily.   multivitamin (ONE-A-DAY MEN'S) TABS tablet Take 1 tablet by mouth daily.   [DISCONTINUED] naproxen (NAPROSYN) 375 MG tablet Take 1 tablet (375 mg total) by mouth 2 (two) times daily.   [DISCONTINUED] predniSONE (DELTASONE) 10 MG tablet Take 4 tabs qd x 2 days, 3 qd x 2 days, 2 qd x 2d, 1qd x 3 days    Review of Systems: Cardiovascular: negative for chest pain, palpitations, leg swelling, orthopnea Respiratory: negative for SOB, wheezing or persistent cough Gastrointestinal: negative for abdominal pain Genitourinary: negative for dysuria or gross hematuria  Objective  Vitals: BP 124/78   Pulse 70   Temp 98.2 F (36.8 C)   Ht 5\' 8"  (1.727 m)   Wt 198 lb 12.8 oz (90.2 kg)   SpO2 98%   BMI 30.23 kg/m  General: no acute distress  Psych:  Alert and oriented, normal mood and affect HEENT:  Normocephalic, atraumatic, supple neck  Cardiovascular:  RRR  without murmur. no edema Respiratory:  Good breath sounds bilaterally, CTAB with normal respiratory effort Skin:  Warm, no rashes Neurologic:   Mental status is normal Commons side effects, risks, benefits, and alternatives for medications and treatment plan prescribed today were discussed, and the patient expressed understanding of the given instructions. Patient is instructed to call or message via MyChart if he/she has any questions or concerns regarding our treatment plan. No barriers to understanding were identified. We discussed Red Flag symptoms and signs in detail. Patient expressed  understanding regarding what to do in case of urgent or emergency type symptoms.  Medication list was reconciled, printed and provided to the patient in AVS. Patient instructions and summary information was reviewed with the patient as documented in the AVS. This note was prepared with assistance of Dragon voice recognition software. Occasional wrong-word or sound-a-like substitutions may have occurred due to the inherent limitation

## 2021-11-27 NOTE — Patient Instructions (Signed)
Please return in February 2024 for your annual complete physical; please come fasting.   I will release your lab results to you on your MyChart account with further instructions. You may see the results before I do, but when I review them I will send you a message with my report or have my assistant call you if things need to be discussed. Please reply to my message with any questions. Thank you!   We will call you to get you set up with GI for your colon cancer screening colonoscopy.  If you have any questions or concerns, please don't hesitate to send me a message via MyChart or call the office at [phone]. Thank you for visiting with Collin Kaiser today! It's our pleasure caring for you.

## 2021-11-28 LAB — URINE CYTOLOGY ANCILLARY ONLY
Chlamydia: NEGATIVE
Comment: NEGATIVE
Comment: NORMAL
Neisseria Gonorrhea: NEGATIVE

## 2021-11-28 LAB — HIV ANTIBODY (ROUTINE TESTING W REFLEX): HIV 1&2 Ab, 4th Generation: NONREACTIVE

## 2021-11-28 LAB — RPR: RPR Ser Ql: NONREACTIVE

## 2021-12-29 ENCOUNTER — Encounter: Payer: Self-pay | Admitting: Gastroenterology

## 2022-01-30 ENCOUNTER — Telehealth: Payer: Self-pay | Admitting: *Deleted

## 2022-01-30 NOTE — Telephone Encounter (Signed)
Attempted to call x 2 ,message left with call back # to return call to reschedule pre-visit or upcoming procedure on 02/26/22 will be cancelled.

## 2022-01-30 NOTE — Telephone Encounter (Signed)
No return call received,no show letter sent,pre-visit and procedure cancelled. 

## 2022-02-22 ENCOUNTER — Emergency Department (HOSPITAL_COMMUNITY)
Admission: EM | Admit: 2022-02-22 | Discharge: 2022-02-22 | Disposition: A | Discharge status: Home / Self Care | Payer: Medicaid Other | Attending: Emergency Medicine | Admitting: Emergency Medicine

## 2022-02-22 ENCOUNTER — Encounter (HOSPITAL_COMMUNITY): Payer: Self-pay

## 2022-02-22 ENCOUNTER — Other Ambulatory Visit (HOSPITAL_COMMUNITY): Payer: Self-pay

## 2022-02-22 ENCOUNTER — Other Ambulatory Visit: Payer: Self-pay

## 2022-02-22 ENCOUNTER — Emergency Department (HOSPITAL_COMMUNITY): Payer: Medicaid Other

## 2022-02-22 DIAGNOSIS — I10 Essential (primary) hypertension: Secondary | ICD-10-CM | POA: Diagnosis not present

## 2022-02-22 DIAGNOSIS — R079 Chest pain, unspecified: Secondary | ICD-10-CM | POA: Insufficient documentation

## 2022-02-22 DIAGNOSIS — Z760 Encounter for issue of repeat prescription: Secondary | ICD-10-CM | POA: Insufficient documentation

## 2022-02-22 DIAGNOSIS — R0789 Other chest pain: Secondary | ICD-10-CM | POA: Diagnosis not present

## 2022-02-22 DIAGNOSIS — Z79899 Other long term (current) drug therapy: Secondary | ICD-10-CM | POA: Insufficient documentation

## 2022-02-22 LAB — BASIC METABOLIC PANEL
Anion gap: 14 (ref 5–15)
BUN: 18 mg/dL (ref 6–20)
CO2: 24 mmol/L (ref 22–32)
Calcium: 9.2 mg/dL (ref 8.9–10.3)
Chloride: 98 mmol/L (ref 98–111)
Creatinine, Ser: 0.87 mg/dL (ref 0.61–1.24)
GFR, Estimated: >60 mL/min (ref >60)
Glucose, Bld: 102 mg/dL — ABNORMAL HIGH (ref 70–99)
Potassium: 3.6 mmol/L (ref 3.5–5.1)
Sodium: 136 mmol/L (ref 135–145)

## 2022-02-22 LAB — TROPONIN I (HIGH SENSITIVITY): Troponin I (High Sensitivity): 8 ng/L (ref <18)

## 2022-02-22 LAB — CBC
HCT: 41.4 % (ref 39.0–52.0)
Hemoglobin: 14.2 g/dL (ref 13.0–17.0)
MCH: 32.9 pg (ref 26.0–34.0)
MCHC: 34.3 g/dL (ref 30.0–36.0)
MCV: 95.8 fL (ref 80.0–100.0)
Platelets: 254 10*3/uL (ref 150–400)
RBC: 4.32 MIL/uL (ref 4.22–5.81)
RDW: 12 % (ref 11.5–15.5)
WBC: 5.8 10*3/uL (ref 4.0–10.5)
nRBC: 0 % (ref 0.0–0.2)

## 2022-02-22 LAB — D-DIMER, QUANTITATIVE: D-Dimer, Quant: <0.27 ug/mL-FEU (ref 0.00–0.50)

## 2022-02-22 MED ORDER — LOSARTAN POTASSIUM 25 MG PO TABS
25.0000 mg | ORAL_TABLET | Freq: Every day | ORAL | 0 refills | Status: DC
  Filled 2022-02-22: qty 30, 30d supply, fill #0

## 2022-02-22 MED ORDER — ROSUVASTATIN CALCIUM 5 MG PO TABS
5.0000 mg | ORAL_TABLET | Freq: Every day | ORAL | 0 refills | Status: DC
  Filled 2022-02-22: qty 30, 30d supply, fill #0

## 2022-02-22 MED ORDER — LOSARTAN POTASSIUM 25 MG PO TABS
25.0000 mg | ORAL_TABLET | ORAL | Status: AC
  Administered 2022-02-22: 25 mg via ORAL
  Filled 2022-02-22: qty 1

## 2022-02-22 MED ORDER — ROSUVASTATIN CALCIUM 5 MG PO TABS
5.0000 mg | ORAL_TABLET | ORAL | Status: AC
  Administered 2022-02-22: 5 mg via ORAL
  Filled 2022-02-22: qty 1

## 2022-02-22 NOTE — ED Triage Notes (Addendum)
Pt states he ran out of his Losartan 1 week ago. Pt states he needs a refill of his BP med. Pt states he currently does not have health insurance.

## 2022-02-22 NOTE — ED Provider Notes (Signed)
[facility] Provider Note   CSN: [id number] Arrival date & time: 02/22/22  1414     History  Chief Complaint  Patient presents with   Medication Refill    Collin Kaiser is a 47 y.o. male with medical history of substance abuse, frequent headaches, anxiety, high cholesterol, hypertension, substance use.  Patient reports to ED for evaluation of chest pain, shortness of breath and medication refill.  Patient reports that beginning last night and into this morning he has had persistent chest tightness that radiates into his right and left shoulders, back.  Patient states he is also short of breath.  Patient denies lightheadedness, syncope, leg swelling, nausea, vomiting or fevers, abdominal pain.  Patient reports remote history of hemoptysis 4 weeks ago which she never was seen for, also complaining of cough for the last 2 weeks.  Patient also here for medication refill.  Patient reports that he has been out of losartan for the last 1.5 weeks.  Patient reports that he has lost his job recently which is causing a lot of stress.  Patient denies SI, HI, AVH.  Patient also reports he has been drinking nightly 3-4 alcoholic beverages including whiskey the last couple weeks.     Medication Refill      Home Medications Prior to Admission medications   Medication Sig Start Date End Date Taking? Authorizing Provider  losartan (COZAAR) 25 MG tablet Take 1 tablet (25 mg total) by mouth daily. 02/22/22  Yes Azucena Cecil, PA-C  rosuvastatin (CRESTOR) 5 MG tablet Take 1 tablet (5 mg total) by mouth daily. 02/22/22  Yes Azucena Cecil, PA-C  albuterol (VENTOLIN HFA) 108 (90 Base) MCG/ACT inhaler Inhale 1-2 puffs into the lungs every 6 (six) hours as needed for wheezing or shortness of breath. 12/21/19   Orma Flaming, MD  cyclobenzaprine (FLEXERIL) 10 MG tablet Take 1 tablet (10 mg total) by mouth 3 (three) times daily as needed for muscle spasms.  05/17/21   Hayden Rasmussen, MD  EPINEPHrine 0.3 mg/0.3 mL IJ SOAJ injection Inject 0.3 mg into the muscle once as needed. Allergic reaction 05/03/20   Kennyth Arnold, FNP  losartan (COZAAR) 25 MG tablet Take 1 tablet (25 mg total) by mouth daily. 02/20/21   Leamon Arnt, MD  multivitamin (ONE-A-DAY MEN'S) TABS tablet Take 1 tablet by mouth daily.    [provider]  rosuvastatin (CRESTOR) 5 MG tablet Take 1 tablet (5 mg total) by mouth daily. 11/27/21   Leamon Arnt, MD      Allergies    Shellfish allergy, Lactose, and Tamiflu [oseltamivir phosphate]    Review of Systems   Review of Systems  Constitutional:  Negative for fever.  Respiratory:  Positive for cough and shortness of breath.   Cardiovascular:  Positive for chest pain. Negative for leg swelling.  Gastrointestinal:  Negative for abdominal pain, nausea and vomiting.  Neurological:  Negative for dizziness, syncope and light-headedness.  All other systems reviewed and are negative.   Physical Exam Updated Vital Signs BP (!) 156/108 (BP Location: Left Arm)   Pulse 99   Temp 97.8 F (36.6 C) (Oral)   Resp 18   SpO2 98%  Physical Exam Vitals and nursing note reviewed.  Constitutional:      General: He is not in acute distress.    Appearance: He is not ill-appearing, toxic-appearing or diaphoretic.  HENT:     Head: Normocephalic and atraumatic.  Nose: Nose normal. No congestion.     Mouth/Throat:     Mouth: Mucous membranes are moist.     Pharynx: Oropharynx is clear.  Eyes:     Extraocular Movements: Extraocular movements intact.     Conjunctiva/sclera: Conjunctivae normal.     Pupils: Pupils are equal, round, and reactive to light.  Cardiovascular:     Rate and Rhythm: Normal rate and regular rhythm.  Pulmonary:     Effort: Pulmonary effort is normal.     Breath sounds: Normal breath sounds. No wheezing.  Abdominal:     General: Abdomen is flat. Bowel sounds are normal.     Palpations: Abdomen is  soft.     Tenderness: There is no abdominal tenderness.  Musculoskeletal:     Cervical back: Normal range of motion and neck supple. No tenderness.     Right lower leg: No edema.     Left lower leg: No edema.  Skin:    General: Skin is warm and dry.     Capillary Refill: Capillary refill takes less than 2 seconds.  Neurological:     General: No focal deficit present.     Mental Status: He is alert and oriented to person, place, and time.     GCS: GCS eye subscore is 4. GCS verbal subscore is 5. GCS motor subscore is 6.     Cranial Nerves: Cranial nerves 2-12 are intact. No cranial nerve deficit.     Sensory: Sensation is intact. No sensory deficit.     Motor: Motor function is intact. No weakness.     ED Results / Procedures / Treatments   Labs (all labs ordered are listed, but only abnormal results are displayed) Labs Reviewed  BASIC METABOLIC PANEL  CBC  D-DIMER, QUANTITATIVE  TROPONIN I (HIGH SENSITIVITY)    EKG None  Radiology No results found.  Procedures Procedures   Medications Ordered in ED Medications  losartan (COZAAR) tablet 25 mg (has no administration in time range)  rosuvastatin (CRESTOR) tablet 5 mg (has no administration in time range)    ED Course/ Medical Decision Making/ A&P  Medical Decision Making Amount and/or Complexity of Data Reviewed Labs: ordered. Radiology: ordered.  Risk Prescription drug management.   47 year old male presents to the ED for evaluation.  Please see HPI for further details.  On my examination the patient is afebrile and nontachycardic.  Patient lung sounds are clear bilaterally, he is not hypoxic on room air.  Abdomen soft and compressible throughout.  No reproducible chest pain on palpation.  Neurological examination shows no focal neurodeficits.  Reached out to social work, Tree surgeon, on Tourist information centre manager has provided patient with documentation to afford his blood pressure medication as well as his Crestor.  These  medications of been sent into was along outpatient pharmacy.  Patient made aware.  Patient workup include the following labs and imaging studies to include CBC, BMP, troponin x 2, D-dimer, EKG, chest x-ray.  Patient be signed out to oncoming provider Reinholdt PA-C pending laboratory studies.  Patient stable condition at this time.   Final Clinical Impression(s) / ED Diagnoses Final diagnoses:  Medication refill  Chest pain, unspecified type    Rx / DC Orders ED Discharge Orders          Ordered    rosuvastatin (CRESTOR) 5 MG tablet  Daily        02/22/22 1517    losartan (COZAAR) 25 MG tablet  Daily  02/22/22 1517              Azucena Cecil, PA-C 02/22/22 1532    Ezequiel Essex, MD 02/22/22 1553

## 2022-02-22 NOTE — ED Provider Notes (Signed)
Patient care assumed from Genevive Bi, PA-C at shift handoff  In short, 47 year old male patient with history of substance abuse, frequent headaches, anxiety, hypertension reported the emergency department for evaluation of chest pain, shortness of breath, and blood pressure medication refill.  Patient reported that last night and into this morning he had persistent chest tightness radiating into his right and left shoulders.  Patient also complained of shortness of breath.  He denied other associated symptoms such as syncope, leg swelling, nausea, vomiting, abdominal pain.  Patient also reports 2 weeks of cough.  Patient endorses recent job loss causing increased stress.  Chest pain labs, chest x-ray ordered prior to my assumption of care Physical Exam  BP (!) 156/108 (BP Location: Left Arm)   Pulse 99   Temp 97.8 F (36.6 C) (Oral)   Resp 18   SpO2 98%   Physical Exam Vitals reviewed.  Constitutional:      General: He is not in acute distress. HENT:     Head: Normocephalic.  Cardiovascular:     Rate and Rhythm: Normal rate.  Pulmonary:     Effort: Pulmonary effort is normal.  Neurological:     Mental Status: He is alert.     Procedures  Procedures  ED Course / MDM    Medical Decision Making Amount and/or Complexity of Data Reviewed Labs: ordered. Radiology: ordered.  Risk Prescription drug management.   Initial troponin 8, EKG with sinus rhythm with no ischemic changes.  Very low clinical suspicion of ACS.  D-dimer negative showing no clot burden.  Presentation not consistent at this time with dissection.  Grossly unremarkable BMP, CBC  I personally interpreted the chest x-ray which showed no active disease  Unclear etiology of patient's chest pain at this time.  May be anxiety related.  May also be stress related with patient's recent job loss.  Plan to discharge patient home at this time.  I will recommend follow-up with primary care.  The patient is currently  chest pain-free.  He has been given return precautions including chest pain and shortness of breath       Ronny Bacon 02/22/22 1714    Blanchie Dessert, MD 02/22/22 [phone]

## 2022-02-22 NOTE — Care Management (Signed)
Transition of Care [facility]) - Emergency Department Mini Assessment   Patient Details  Name: Collin Kaiser MRN: [id number] Date of Birth: [birth year]  Transition of [facility]) CM/SW Contact:    Roseanne Kaufman, RN Phone Number: 02/22/2022, 3:01 PM   Clinical Narrative: Received TOC consult for medication assistance. This RNCM spoke with patient at bedside who reports he is having some chest tightness and needs assistance with getting his medications( losartan and crestor). Patient reports he lost his job recently and currently does not have any medical insurance. This RNCM explained the [facility] program. Patient is eligible for [facility] program. Patient reports he can afford the $3 per medication copay. Patient will have medications filled at [facility]. This RNCM notified EDP.  No additional TOC needs at this time.   ED Mini Assessment: What brought you to the Emergency Department? : chest tightness and medication assistance  Barriers to Discharge: No Barriers Identified  Barrier interventions: medication assistance  Means of departure: Car  Interventions which prevented an admission or readmission: Medication Review    Patient Contact and Communications        ,          Patient states their goals for this hospitalization and ongoing recovery are:: return home   Choice offered to / list presented to : Patient  Admission diagnosis:  Hypertention, out of meds Patient Active Problem List   Diagnosis Date Noted   Alcoholism ([place]) 02/20/2021   Essential hypertension 02/20/2021   Chronic cough 06/30/2020   GAD (generalized anxiety disorder) 02/15/2020   Varicose veins of leg with swelling, right 12/24/2019   Bilateral carpal tunnel syndrome 11/20/2018   Mixed hyperlipidemia 06/24/2017   History of substance abuse ([place]) 06/21/2017   PCP:  Leamon Arnt, MD Pharmacy:   [facility] [place] [place] [address] Phone: [phone] Fax: [phone]  [facility] [place], [address] [place] [address] Phone: [phone] Fax: [phone]  [facility] [place], [address] [place] [address] Phone: [phone] Fax: [phone]  [place] [address] Phone: [phone] Fax: [phone]

## 2022-02-22 NOTE — Discharge Instructions (Signed)
You were evaluated today for chest pain and shortness of breath.  Your workup was reassuring for no signs of acute coronary syndrome.  Labs show no sign of a clot burden.  There was no pneumonia on chest x-ray.  Please follow-up with your primary care provider as needed.  If you have increased chest pain or shortness of breath, or develop other life-threatening symptoms, please return to the emergency department.  Refills were sent of your Crestor and Cozaar (rosuvastatin, losartan) please follow-up with primary care for further refills as needed.

## 2022-02-22 NOTE — Care Management (Signed)
  [place] Medication Assistance Card Name: Collin Kaiser ID (MRN): [id number] Bin: [id number] RX Group: [id number] Discharge Date: 02/22/2022 Expiration Date:03/02/2022                                           (must be filled within 7 days of discharge)     You have been approved to have the prescriptions written by your discharging physician filled through our [facility] (Medication Assistance Through [facility]) program. This program allows for a one-time (no refills) 34-day supply of selected medications for a low copay amount.  The copay is $3.00 per prescription. For instance, if you have one prescription, you will pay $3.00; for two prescriptions, you pay $6.00; for three prescriptions, you pay $9.00; and so on.  Only certain pharmacies are participating in this program with [facility]. You will need to select one of the pharmacies from the attached list and take your prescriptions, this letter, and your photo ID to one of the [place] pharmacies, [facility], [facility] [address] or [facility] [address].   We are excited that you are able to use the [facility] program to get your medications. These prescriptions must be filled within 7 days of hospital discharge or they will no longer be valid for the [facility] program. Should you have any problems with your prescriptions please contact your case management team member at [phone] for [place]/ [place] you, [place] Management

## 2022-02-23 ENCOUNTER — Other Ambulatory Visit (HOSPITAL_COMMUNITY): Payer: Self-pay

## 2022-02-26 ENCOUNTER — Other Ambulatory Visit (HOSPITAL_COMMUNITY): Payer: Self-pay

## 2022-02-26 ENCOUNTER — Encounter: Payer: BC Managed Care – PPO | Admitting: Gastroenterology

## 2022-03-01 ENCOUNTER — Other Ambulatory Visit (HOSPITAL_COMMUNITY): Payer: Self-pay

## 2022-03-12 ENCOUNTER — Ambulatory Visit (INDEPENDENT_AMBULATORY_CARE_PROVIDER_SITE_OTHER): Payer: Medicaid Other | Admitting: Family Medicine

## 2022-03-12 ENCOUNTER — Encounter: Payer: Self-pay | Admitting: Family Medicine

## 2022-03-12 VITALS — BP 124/88 | HR 101 | Temp 98.1°F | Ht 68.0 in | Wt 210.4 lb

## 2022-03-12 DIAGNOSIS — F1911 Other psychoactive substance abuse, in remission: Secondary | ICD-10-CM

## 2022-03-12 DIAGNOSIS — R053 Chronic cough: Secondary | ICD-10-CM

## 2022-03-12 DIAGNOSIS — F411 Generalized anxiety disorder: Secondary | ICD-10-CM

## 2022-03-12 DIAGNOSIS — E782 Mixed hyperlipidemia: Secondary | ICD-10-CM | POA: Diagnosis not present

## 2022-03-12 DIAGNOSIS — F1020 Alcohol dependence, uncomplicated: Secondary | ICD-10-CM | POA: Diagnosis not present

## 2022-03-12 DIAGNOSIS — Z0000 Encounter for general adult medical examination without abnormal findings: Secondary | ICD-10-CM

## 2022-03-12 DIAGNOSIS — I10 Essential (primary) hypertension: Secondary | ICD-10-CM | POA: Diagnosis not present

## 2022-03-12 LAB — HEPATIC FUNCTION PANEL
ALT: 61 U/L — ABNORMAL HIGH (ref 0–53)
AST: 84 U/L — ABNORMAL HIGH (ref 0–37)
Albumin: 4.3 g/dL (ref 3.5–5.2)
Alkaline Phosphatase: 75 U/L (ref 39–117)
Bilirubin, Direct: 0.2 mg/dL (ref 0.0–0.3)
Total Bilirubin: 1.2 mg/dL (ref 0.2–1.2)
Total Protein: 7.7 g/dL (ref 6.0–8.3)

## 2022-03-12 LAB — LDL CHOLESTEROL, DIRECT: Direct LDL: 45 mg/dL

## 2022-03-12 LAB — LIPID PANEL
Cholesterol: 234 mg/dL — ABNORMAL HIGH (ref 0–200)
HDL: 86.8 mg/dL (ref >39.00)
Total CHOL/HDL Ratio: 3
Triglycerides: 493 mg/dL — ABNORMAL HIGH (ref 0.0–149.0)

## 2022-03-12 MED ORDER — LOSARTAN POTASSIUM 25 MG PO TABS: 25.0000 mg | ORAL_TABLET | Freq: Every day | ORAL | 3 refills | Status: DC

## 2022-03-12 MED ORDER — ROSUVASTATIN CALCIUM 5 MG PO TABS: 5.0000 mg | ORAL_TABLET | Freq: Every day | ORAL | 3 refills | Status: AC
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
## 2022-03-12 NOTE — Patient Instructions (Addendum)
Please return in 3 months to recheck your blood pressure. Please call GI to set up the colonoscopy again.   Please get yourself to an Alcoholic Anonymous meeting. Take the first step.  Look up [facility]; they can be a resource as well.   I will release your lab results to you on your MyChart account with further instructions. You may see the results before I do, but when I review them I will send you a message with my report or have my assistant call you if things need to be discussed. Please reply to my message with any questions. Thank you!   If you have any questions or concerns, please don't hesitate to send me a message via MyChart or call the office at [phone]. Thank you for visiting with [place] today! It's our pleasure caring for you.   Alcohol Use Disorder Alcohol use disorder is a condition in which drinking disrupts daily life. People with this condition drink too much alcohol and cannot control their drinking. Alcohol use disorder can cause serious problems with physical health. It can affect the brain, heart, and other internal organs. This disorder can raise the risk for certain cancers and cause problems with mental health, such as depression or anxiety. What are the causes? This condition is caused by drinking too much alcohol over time. Some people with this condition drink to cope with or escape from negative life events. Others drink to relieve symptoms of physical pain or symptoms of mental illness. What increases the risk? You are more likely to develop this condition if: You have a family history of alcohol use disorder. Your culture encourages drinking to the point of becoming drunk (intoxication). You had a mood or conduct disorder in childhood. You have been abused. You are an adolescent and you: Have poor performance in school. Have poor supervision or guidance. Act on impulse and like taking risks. What are the signs or symptoms? Symptoms of this condition  include: Drinking more than you want to. Trying several times without success to drink less. Spending a lot of time thinking about alcohol, getting alcohol, drinking alcohol, or recovering from drinking alcohol. Continuing to drink even when it is causing serious problems in your daily life. Drinking when it is dangerous to drink, such as before driving a car. Needing more and more alcohol to get the same effect you want (building up tolerance). Having symptoms of withdrawal when you stop drinking. Withdrawal symptoms may include: Trouble sleeping, leading to tiredness (fatigue). Mood swings of depression and anxiety. Physical symptoms, such as a fast heart rate, rapid breathing, high blood pressure (hypertension), fever, cold sweats, or nausea. Seizures. Severe confusion. Feeling or seeing things that are not there (hallucinations). Shaking movements that you cannot control (tremors). How is this diagnosed? This condition is diagnosed with an assessment. Your health care provider may start by asking three or four questions about your drinking, or they may give you a simple test to take. This helps to get clear information from you. You may also have a physical exam or lab tests. You may be referred to a substance abuse counselor. How is this treated? With education, some people with alcohol use disorder are able to reduce their drinking. Many with this disorder cannot change their drinking behavior on their own and need help with treatment from substance use specialists. Treatments may include: Detoxification. Detoxification involves quitting drinking with supervision and direction of health care providers. Your health care provider may prescribe medicines within the  first week to help lessen withdrawal symptoms. Alcohol withdrawal can be dangerous and life-threatening. Detoxification may be provided in a home, community, or primary care setting, or in a hospital or substance use treatment  facility. Counseling. This may involve motivational interviewing (MI), family therapy, or cognitive behavioral therapy (CBT). A counselor can address the things you can do to change your drinking behavior and how to maintain the changes. Talk therapy aims to: Identify your positive motivations to change. Identify and avoid the things that trigger your drinking. Help you learn how to plan your behavior change. Develop support systems that can help you sustain the change. Medicines. Medicines can help treat this disorder by: Decreasing cravings. Decreasing the positive feeling you have when you drink. Causing an uncomfortable physical reaction when you drink (aversion therapy). Support groups such as Alcoholics Anonymous (AA). These groups are led by people who have quit drinking. The groups provide emotional support, advice, and guidance. Some people with this condition benefit from a combination of treatments provided by specialized substance use treatment centers. Follow these instructions at home:  Medicines Take over-the-counter and prescription medicines only as told by your health care provider. Ask before starting any new medicines, herbs, or supplements. General instructions Ask friends and family members to support your choice to stay sober. Avoid places where alcohol is served. Create a plan to deal with tempting situations. Attend support groups regularly. Practice hobbies or activities you enjoy. Do not drink and drive. How is this prevented? Do not drink alcohol if your health care provider tells you not to drink. If you drink alcohol: Limit how much you have to: 0-1 drink a day for women who are not pregnant. 0-2 drinks a day for men. Know how much alcohol is in your drink. In the [place] one drink equals one 12 oz bottle of beer (355 mL), one 5 oz glass of wine (148 mL), or one 1 oz glass of hard liquor (44 mL). If you have a mental health condition, seek  treatment. Develop a healthy lifestyle through: Meditation or deep breathing. Exercise. Spending time in nature. Listening to music. Talking with a trusted friend or family member. If you are a teen: Do not drink alcohol. Avoid gatherings where you might be tempted to drink alcohol. Do not be afraid to say no if someone offers you alcohol. Speak up about why you do not want to drink. Set a positive example for others around you by not drinking. Build relationships with friends who do not drink. Where to find more information Substance Abuse and Mental Health Services Administration: SamedayNews.com.cy Alcoholics Anonymous: ShedSizes.ch Contact a health care provider if: You cannot take your medicines as told. Your symptoms get worse or you experience symptoms of withdrawal when you stop drinking. You start drinking again (relapse) and your symptoms get worse. Get help right away if: You have thoughts about hurting yourself or others. Get help right away if you feel like you may hurt yourself or others, or have thoughts about taking your own life. Go to your nearest emergency room or: Call 911. Call the [place] at 816-195-3366 or 988. This is open 24 hours a day. Text the Crisis Text Line at (858) 509-5040. Summary Alcohol use disorder is a condition in which drinking disrupts daily life. People with this condition drink too much alcohol and cannot control their drinking. Treatment may include detoxification, counseling, medicines, and support groups. Ask friends and family members to support you. Avoid situations where alcohol is  served. Get help right away if you have thoughts about hurting yourself or others. This information is not intended to replace advice given to you by your health care provider. Make sure you discuss any questions you have with your health care provider. Document Revised: 03/08/2021 Document Reviewed: 03/08/2021 Elsevier Patient Education  [place].

## 2022-05-12 ENCOUNTER — Other Ambulatory Visit (HOSPITAL_COMMUNITY): Payer: Self-pay

## 2022-06-18 ENCOUNTER — Ambulatory Visit: Payer: Medicaid Other | Admitting: Family Medicine

## 2022-06-21 ENCOUNTER — Ambulatory Visit: Payer: Medicaid Other | Admitting: Family Medicine

## 2022-11-09 ENCOUNTER — Other Ambulatory Visit: Payer: Self-pay

## 2022-11-09 ENCOUNTER — Emergency Department (HOSPITAL_COMMUNITY)
Admission: EM | Admit: 2022-11-09 | Discharge: 2022-11-10 | Disposition: A | Discharge status: Home / Self Care | Payer: Medicaid Other | Attending: Emergency Medicine | Admitting: Emergency Medicine

## 2022-11-09 ENCOUNTER — Emergency Department (HOSPITAL_COMMUNITY): Payer: Medicaid Other

## 2022-11-09 DIAGNOSIS — Z79899 Other long term (current) drug therapy: Secondary | ICD-10-CM | POA: Diagnosis not present

## 2022-11-09 DIAGNOSIS — I10 Essential (primary) hypertension: Secondary | ICD-10-CM | POA: Diagnosis not present

## 2022-11-09 DIAGNOSIS — M5412 Radiculopathy, cervical region: Secondary | ICD-10-CM | POA: Diagnosis not present

## 2022-11-09 DIAGNOSIS — M542 Cervicalgia: Secondary | ICD-10-CM | POA: Diagnosis present

## 2022-11-09 LAB — CBC
HCT: 40.2 % (ref 39.0–52.0)
Hemoglobin: 13.3 g/dL (ref 13.0–17.0)
MCH: 30.9 pg (ref 26.0–34.0)
MCHC: 33.1 g/dL (ref 30.0–36.0)
MCV: 93.5 fL (ref 80.0–100.0)
Platelets: 297 10*3/uL (ref 150–400)
RBC: 4.3 MIL/uL (ref 4.22–5.81)
RDW: 11.7 % (ref 11.5–15.5)
WBC: 9.2 10*3/uL (ref 4.0–10.5)
nRBC: 0 % (ref 0.0–0.2)

## 2022-11-09 LAB — BASIC METABOLIC PANEL
Anion gap: 13 (ref 5–15)
BUN: 17 mg/dL (ref 6–20)
CO2: 22 mmol/L (ref 22–32)
Calcium: 8.8 mg/dL — ABNORMAL LOW (ref 8.9–10.3)
Chloride: 102 mmol/L (ref 98–111)
Creatinine, Ser: 1.04 mg/dL (ref 0.61–1.24)
GFR, Estimated: >60 mL/min (ref >60)
Glucose, Bld: 124 mg/dL — ABNORMAL HIGH (ref 70–99)
Potassium: 3.5 mmol/L (ref 3.5–5.1)
Sodium: 137 mmol/L (ref 135–145)

## 2022-11-09 LAB — TROPONIN I (HIGH SENSITIVITY): Troponin I (High Sensitivity): 12 ng/L (ref <18)

## 2022-11-09 NOTE — ED Triage Notes (Signed)
Patient reports left chest pain radiating to left upper arm yesterday " spasms" , respirations unlabored .

## 2022-11-10 LAB — TROPONIN I (HIGH SENSITIVITY): Troponin I (High Sensitivity): 11 ng/L (ref <18)

## 2022-11-10 MED ORDER — OXYCODONE-ACETAMINOPHEN 5-325 MG PO TABS
1.0000 | ORAL_TABLET | Freq: Once | ORAL | Status: AC
  Administered 2022-11-10: 1 via ORAL
  Filled 2022-11-10: qty 1

## 2022-11-10 MED ORDER — PREDNISONE 50 MG PO TABS: 50.0000 mg | ORAL_TABLET | Freq: Every day | ORAL | 0 refills | Status: DC

## 2022-11-10 MED ORDER — DIAZEPAM 5 MG PO TABS: 5.0000 mg | ORAL_TABLET | Freq: Two times a day (BID) | ORAL | 0 refills | Status: DC | PRN

## 2022-11-10 MED ORDER — DEXAMETHASONE SODIUM PHOSPHATE 10 MG/ML IJ SOLN
10.0000 mg | Freq: Once | INTRAMUSCULAR | Status: AC
  Administered 2022-11-10: 10 mg via INTRAMUSCULAR
  Filled 2022-11-10: qty 1

## 2022-11-10 MED ORDER — KETOROLAC TROMETHAMINE 30 MG/ML IJ SOLN
30.0000 mg | Freq: Once | INTRAMUSCULAR | Status: AC
  Administered 2022-11-10: 30 mg via INTRAMUSCULAR
  Filled 2022-11-10: qty 1

## 2022-11-10 MED ORDER — OXYCODONE-ACETAMINOPHEN 5-325 MG PO TABS: 1.0000 | ORAL_TABLET | Freq: Four times a day (QID) | ORAL | 0 refills | Status: DC | PRN

## 2022-11-10 NOTE — ED Provider Notes (Signed)
[place] EMERGENCY DEPARTMENT AT [facility] Provider Note   CSN: [id number] Arrival date & time: 11/09/22  2225     History  Chief Complaint  Patient presents with   Chest Pain   Arm Pain     Collin Kaiser is a 47 y.o. male.  Pt is a 47 yo male with pmhx significant for high cholesterol, anxiety and htn.  Pt said he's been having left sided neck pain for the past week.  He thinks he slept wrong.  He went to UC and was given decadron and toradol IM while there and sent home with Robaxin.  He said his left shoulder muscle feels weak and it's having spasms which is new.  The pain is also worse.  Pt denies cp.  No f/c.       Home Medications Prior to Admission medications   Medication Sig Start Date End Date Taking? Authorizing Provider  diazepam (VALIUM) 5 MG tablet Take 1 tablet (5 mg total) by mouth 2 (two) times daily as needed for anxiety. 11/10/22  Yes Jacalyn Lefevre, MD  oxyCODONE-acetaminophen (PERCOCET/ROXICET) 5-325 MG tablet Take 1 tablet by mouth every 6 (six) hours as needed for severe pain (pain score 7-10). 11/10/22  Yes Jacalyn Lefevre, MD  predniSONE (DELTASONE) 50 MG tablet Take 1 tablet (50 mg total) by mouth daily with breakfast. 11/10/22  Yes Jacalyn Lefevre, MD  albuterol (VENTOLIN HFA) 108 (90 Base) MCG/ACT inhaler Inhale 1-2 puffs into the lungs every 6 (six) hours as needed for wheezing or shortness of breath. 12/21/19   Orland Mustard, MD  cyclobenzaprine (FLEXERIL) 10 MG tablet Take 1 tablet (10 mg total) by mouth 3 (three) times daily as needed for muscle spasms. 05/17/21   Terrilee Files, MD  EPINEPHrine 0.3 mg/0.3 mL IJ SOAJ injection Inject 0.3 mg into the muscle once as needed. Allergic reaction 05/03/20   Eulis Foster, FNP  losartan (COZAAR) 25 MG tablet Take 1 tablet (25 mg total) by mouth daily. 03/12/22   Willow Ora, MD  multivitamin (ONE-A-DAY MEN'S) TABS tablet Take 1 tablet by mouth daily.    [provider]   rosuvastatin (CRESTOR) 5 MG tablet Take 1 tablet (5 mg total) by mouth daily. 03/12/22   Willow Ora, MD      Allergies    Shellfish allergy, Lactose, and Tamiflu [oseltamivir phosphate]    Review of Systems   Review of Systems  Musculoskeletal:        Left sided neck/arm pain  All other systems reviewed and are negative.   Physical Exam Updated Vital Signs BP (!) 120/98 (BP Location: Right Arm)   Pulse 83   Temp 98.1 F (36.7 C)   Resp 18   SpO2 99%  Physical Exam Vitals and nursing note reviewed.  Constitutional:      Appearance: He is well-developed.  HENT:     Head: Normocephalic and atraumatic.  Eyes:     Extraocular Movements: Extraocular movements intact.     Pupils: Pupils are equal, round, and reactive to light.  Neck:   Cardiovascular:     Rate and Rhythm: Normal rate and regular rhythm.     Heart sounds: Normal heart sounds.  Pulmonary:     Effort: Pulmonary effort is normal.     Breath sounds: Normal breath sounds.  Abdominal:     General: Bowel sounds are normal.     Palpations: Abdomen is soft.  Musculoskeletal:        General:  Normal range of motion.     Cervical back: Normal range of motion and neck supple.  Skin:    General: Skin is warm.     Capillary Refill: Capillary refill takes less than 2 seconds.  Neurological:     General: No focal deficit present.     Mental Status: He is alert and oriented to person, place, and time.  Psychiatric:        Mood and Affect: Mood normal.        Behavior: Behavior normal.     ED Results / Procedures / Treatments   Labs (all labs ordered are listed, but only abnormal results are displayed) Labs Reviewed  BASIC METABOLIC PANEL - Abnormal; Notable for the following components:      Result Value   Glucose, Bld 124 (*)    Calcium 8.8 (*)    All other components within normal limits  CBC  TROPONIN I (HIGH SENSITIVITY)  TROPONIN I (HIGH SENSITIVITY)   EKG with HR 79.  No st or t wave changes.   QRS 78.  Radiology DG Chest 2 View  Result Date: 11/09/2022 CLINICAL DATA:  Chest pain EXAM: CHEST - 2 VIEW COMPARISON:  02/22/2022 FINDINGS: Normal cardiomediastinal silhouette. No focal consolidation, pleural effusion, or pneumothorax. No displaced rib fractures. IMPRESSION: No acute cardiopulmonary disease. Electronically Signed   By: Minerva Fester M.D.   On: 11/09/2022 23:46    Procedures Procedures    Medications Ordered in ED Medications  oxyCODONE-acetaminophen (PERCOCET/ROXICET) 5-325 MG per tablet 1 tablet (1 tablet Oral Given 11/10/22 0114)  ketorolac (TORADOL) 30 MG/ML injection 30 mg (30 mg Intramuscular Given 11/10/22 0731)  dexamethasone (DECADRON) injection 10 mg (10 mg Intramuscular Given 11/10/22 0731)    ED Course/ Medical Decision Making/ A&P                                 Medical Decision Making Amount and/or Complexity of Data Reviewed Labs: ordered. Radiology: ordered.  Risk Prescription drug management.   This patient presents to the ED for concern of neck pain, this involves an extensive number of treatment options, and is a complaint that carries with it a high risk of complications and morbidity.  The differential diagnosis includes msk, cervical radiculopathy, cad   Co morbidities that complicate the patient evaluation  high cholesterol, anxiety and htn   Additional history obtained:  Additional history obtained from epic chart review  Lab Tests:  I Ordered, and personally interpreted labs.  The pertinent results include:  cbc, bmp, trop all nl   Imaging Studies ordered:  I ordered imaging studies including cxr  I independently visualized and interpreted imaging which showed No acute cardiopulmonary disease.  I agree with the radiologist interpretation   Cardiac Monitoring:  The patient was maintained on a cardiac monitor.  I personally viewed and interpreted the cardiac monitored which showed an underlying rhythm of:  nsr   Medicines ordered and prescription drug management:  I ordered medication including toradol/decadron  for pain  Reevaluation of the patient after these medicines showed that the patient improved I have reviewed the patients home medicines and have made adjustments as needed  Problem List / ED Course:  Cervical radiculopathy:  sx c/w with radicular pain.  Pt likely has a compressed nerve.  He is d/c with prednisone, percocet, and valium.  He is to f/u with NS.  Return if worse.     Reevaluation:  After the interventions noted above, I reevaluated the patient and found that they have :improved   Social Determinants of Health:  Lives at home   Dispostion:  After consideration of the diagnostic results and the patients response to treatment, I feel that the patent would benefit from discharge with outpatient f/u.          Final Clinical Impression(s) / ED Diagnoses Final diagnoses:  Cervical radiculopathy    Rx / DC Orders ED Discharge Orders          Ordered    diazepam (VALIUM) 5 MG tablet  2 times daily PRN        11/10/22 0729    predniSONE (DELTASONE) 50 MG tablet  Daily with breakfast        11/10/22 0729    oxyCODONE-acetaminophen (PERCOCET/ROXICET) 5-325 MG tablet  Every 6 hours PRN        11/10/22 0729              Jacalyn Lefevre, MD 11/10/22 [phone]

## 2022-11-10 NOTE — ED Notes (Signed)
Patient Alert and oriented to baseline. Stable and ambulatory to baseline. Patient verbalized understanding of the discharge instructions.  Patient belongings were taken by the patient.   

## 2022-12-18 ENCOUNTER — Ambulatory Visit: Payer: Medicaid Other | Admitting: Family Medicine

## 2022-12-19 ENCOUNTER — Encounter: Payer: Self-pay | Admitting: Family Medicine

## 2022-12-19 ENCOUNTER — Ambulatory Visit (INDEPENDENT_AMBULATORY_CARE_PROVIDER_SITE_OTHER): Payer: Medicaid Other | Admitting: Family Medicine

## 2022-12-19 VITALS — BP 138/84 | HR 95 | Temp 98.0°F | Wt 223.2 lb

## 2022-12-19 DIAGNOSIS — R29898 Other symptoms and signs involving the musculoskeletal system: Secondary | ICD-10-CM | POA: Diagnosis not present

## 2022-12-19 DIAGNOSIS — M25512 Pain in left shoulder: Secondary | ICD-10-CM

## 2022-12-19 DIAGNOSIS — I10 Essential (primary) hypertension: Secondary | ICD-10-CM | POA: Diagnosis not present

## 2022-12-19 MED ORDER — LOSARTAN POTASSIUM 50 MG PO TABS: 50.0000 mg | ORAL_TABLET | Freq: Every day | ORAL | 3 refills | Status: AC

## 2022-12-19 NOTE — Patient Instructions (Addendum)
Please return in 3 months for your annual complete physical; please come fasting.    If you have any questions or concerns, please don't hesitate to send me a message via MyChart or call the office at [phone]. Thank you for visiting with [place] today! It's our pleasure caring for you.   VISIT SUMMARY:  You came in today because of muscle spasms and neck pain that started about four weeks ago. While the initial pain and spasms have resolved, you now have persistent weakness in your left arm, making it difficult to lift objects and perform overhead activities.  YOUR PLAN:  -SHOULDER WEAKNESS: You have ongoing weakness in your left arm following the resolution of your neck pain and muscle spasms. This weakness is affecting your ability to lift and perform overhead tasks. The doctor suspects a rotator cuff tear, which is a common shoulder injury that can cause weakness and limited range of motion. You will be referred to a sports medicine specialist for further evaluation and an MRI will be ordered to confirm the diagnosis. Depending on the severity of the tear, treatment options may include physical therapy or surgery.  -GENERAL HEALTH MAINTENANCE: You are overdue for your annual physical examination, which is important for maintaining your overall health. Please schedule this examination within the next three months.  INSTRUCTIONS:  Please follow up with the sports medicine specialist as soon as possible and complete the MRI of your left shoulder. Additionally, schedule your annual physical examination within the next three months.

## 2022-12-19 NOTE — Progress Notes (Signed)
Subjective  CC:  Chief Complaint  Patient presents with   Extremity Weakness    Pt c/o of weakness in left arm for 1 month, constant weakness, hard to lift things in the left arm   Hypertension    HPI: Collin Kaiser is a 47 y.o. male who presents to the office today to address the problems listed above in the chief complaint. I have reviewed ED visit from 10/2022. Reviewed notes, labs, xrays.  Discussed the use of AI scribe software for clinical note transcription with the patient, who gave verbal consent to proceed.  History of Present Illness   The patient, with an unspecified history, presented with a chief complaint of muscle spasms and neck pain that began approximately four weeks prior to the consultation. The pain was severe enough to warrant a visit to the emergency room. The patient reported that the pain radiated down one arm, which he believed may have been related to his neck pain. He was unsure of the cause, suggesting he may have slept in an awkward position.  Since the initial onset, the neck pain and muscle spasms have resolved. However, the patient reported a new symptom of persistent weakness in the same arm that was previously affected by the radiating pain. He described difficulty with tasks requiring arm strength, such as lifting objects and placing items on shelves. The weakness was present since the onset of the initial pain and has continued even after the resolution of the neck pain and muscle spasms.  The patient denied any specific injury or trauma to the arm, and reported no pain in the hand. He also denied taking any current medications for the symptoms. The patient's lifestyle appeared to be active, as he mentioned owning and operating three restaurants.     HTN f/u:  Feeling well. Taking medications w/o adverse effects. No symptoms of CHF, angina; no palpitations, sob, cp or lower extremity edema. Compliant with meds. Chart review shows borderline control.    Assessment  1. Acute pain of left shoulder   2. Essential hypertension   3. Left arm weakness      Plan  Assessment and Plan    Shoulder pain and arm weakness Persistent left arm weakness post-resolution of neck pain and muscle spasms. Difficulty with lifting and overhead activities. Physical exam shows significant left shoulder weakness, limited range of motion, and inability to resist downward pressure. Differential includes rotator cuff tear versus other shoulder pathology such as cervical radiculopathy. No recent trauma. Suspected rotator cuff tear. Discussed referral to sports medicine and potential MRI to confirm diagnosis and guide treatment options, including physical therapy or surgery based on tear severity. - Refer to sports medicine for further evaluation - consider MRI of the left shoulder  HTN:  Borderline control. Increase losartan to 50mg  daily and recheck in 3 months.   General Health Maintenance Annual physical examination will be due in February. F/u then Visit date not found  Orders Placed This Encounter  Procedures   Ambulatory referral to Sports Medicine   Meds ordered this encounter  Medications   losartan (COZAAR) 50 MG tablet    Sig: Take 1 tablet (50 mg total) by mouth daily.    Dispense:  90 tablet    Refill:  3    Please notify pt: increased dose from 25 to get bp under better control. thanks      I reviewed the patients updated PMH, FH, and SocHx.    Patient Active Problem List  Diagnosis Date Noted   Alcoholism (HCC) 02/20/2021    Priority: High   Essential hypertension 02/20/2021    Priority: High   Mixed hyperlipidemia 06/24/2017    Priority: High   History of substance abuse (HCC) 06/21/2017    Priority: High   Chronic cough 06/30/2020    Priority: Medium    GAD (generalized anxiety disorder) 02/15/2020    Priority: Medium    Bilateral carpal tunnel syndrome 11/20/2018    Priority: Medium    Varicose veins of leg with  swelling, right 12/24/2019    Priority: Low   Current Meds  Medication Sig   albuterol (VENTOLIN HFA) 108 (90 Base) MCG/ACT inhaler Inhale 1-2 puffs into the lungs every 6 (six) hours as needed for wheezing or shortness of breath.   diazepam (VALIUM) 5 MG tablet Take 1 tablet (5 mg total) by mouth 2 (two) times daily as needed for anxiety.   EPINEPHrine 0.3 mg/0.3 mL IJ SOAJ injection Inject 0.3 mg into the muscle once as needed. Allergic reaction   multivitamin (ONE-A-DAY MEN'S) TABS tablet Take 1 tablet by mouth daily.   rosuvastatin (CRESTOR) 5 MG tablet Take 1 tablet (5 mg total) by mouth daily.   [DISCONTINUED] losartan (COZAAR) 25 MG tablet Take 1 tablet (25 mg total) by mouth daily.   [DISCONTINUED] oxyCODONE-acetaminophen (PERCOCET/ROXICET) 5-325 MG tablet Take 1 tablet by mouth every 6 (six) hours as needed for severe pain (pain score 7-10).   [DISCONTINUED] predniSONE (DELTASONE) 50 MG tablet Take 1 tablet (50 mg total) by mouth daily with breakfast.    Allergies: Patient is allergic to shellfish allergy, lactose, and tamiflu [oseltamivir phosphate]. Family History: Patient family history includes Alcohol abuse in his father; Arthritis in his mother; Diabetes in his father; Early death in his father; Hyperlipidemia in his father and mother; Hypertension in his father and mother; Stroke in his father. Social History:  Patient  reports that he has never smoked. He has never used smokeless tobacco. He reports current alcohol use of about 12.0 standard drinks of alcohol per week. He reports that he does not use drugs.  Review of Systems: Constitutional: Negative for fever malaise or anorexia Cardiovascular: negative for chest pain Respiratory: negative for SOB or persistent cough Gastrointestinal: negative for abdominal pain  Objective  Vitals: BP 138/84   Pulse 95   Temp 98 F (36.7 C)   Wt 223 lb 3.2 oz (101.2 kg)   SpO2 97%   BMI 33.94 kg/m  General: no acute distress ,  A&Ox3 Neck: no trap ttp, neg spurlings, decreased ROM due to increased mm bulk? Left shoulder: decreased passive and AROM: difficult to abduct arm fully. + empty can testing with weakness.  Strength: 5/5 bicep, 4+ tricep, 5/5 in forearm and hand Neuro: normal cranial nerves including shoulder shrug  Commons side effects, risks, benefits, and alternatives for medications and treatment plan prescribed today were discussed, and the patient expressed understanding of the given instructions. Patient is instructed to call or message via MyChart if he/she has any questions or concerns regarding our treatment plan. No barriers to understanding were identified. We discussed Red Flag symptoms and signs in detail. Patient expressed understanding regarding what to do in case of urgent or emergency type symptoms.  Medication list was reconciled, printed and provided to the patient in AVS. Patient instructions and summary information was reviewed with the patient as documented in the AVS. This note was prepared with assistance of Dragon voice recognition software. Occasional wrong-word or sound-a-like substitutions  may have occurred due to the inherent limitations of voice recognition software

## 2023-01-22 IMAGING — DX DG CHEST 2V
2 series · 2 of 2 positions shown · non-contrast
Comparison: 02/13/2020, 05/03/2020

CLINICAL DATA: 45-year-old male with follow-up chest x-ray

EXAM:
CHEST - 2 VIEW

[chest pa]
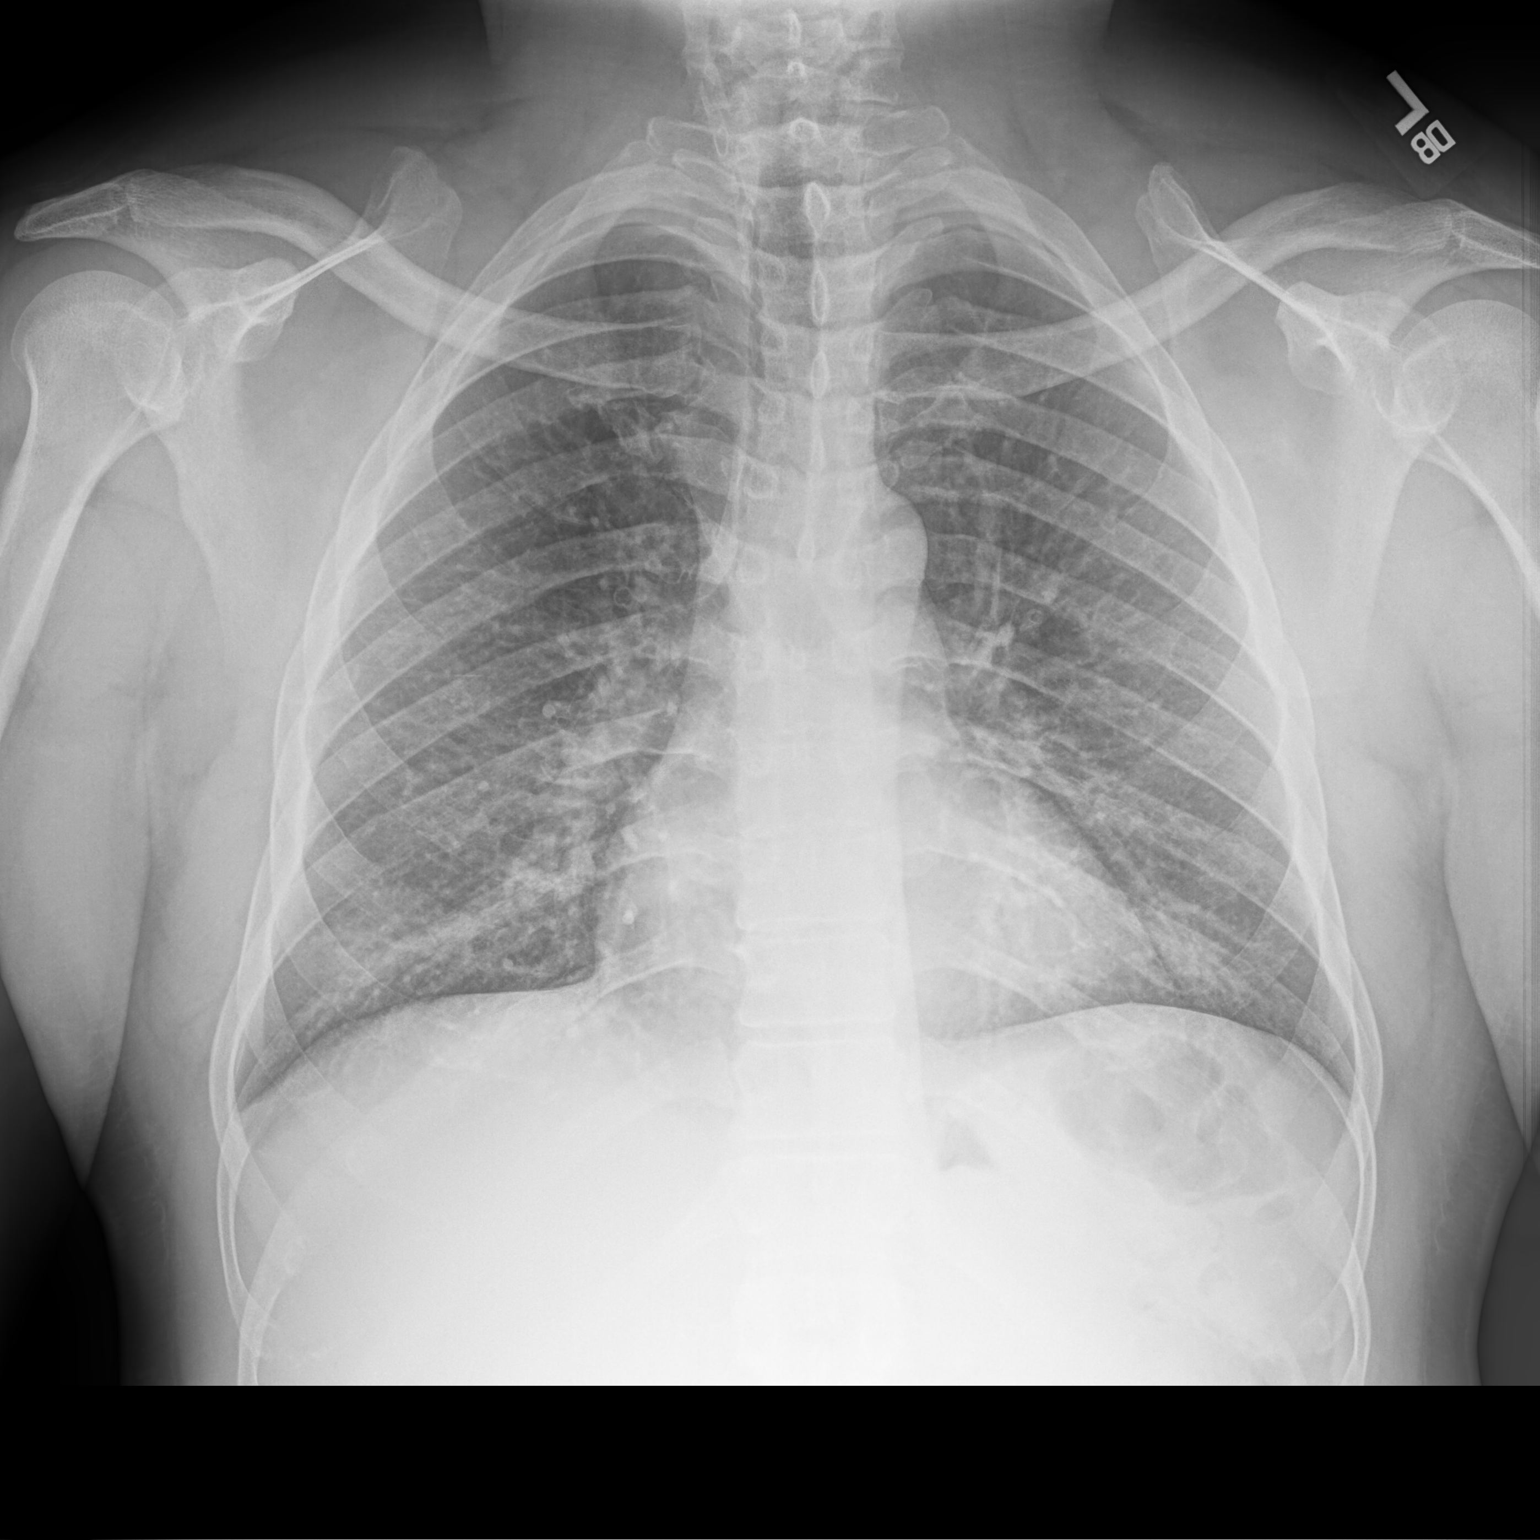

[chest lat]
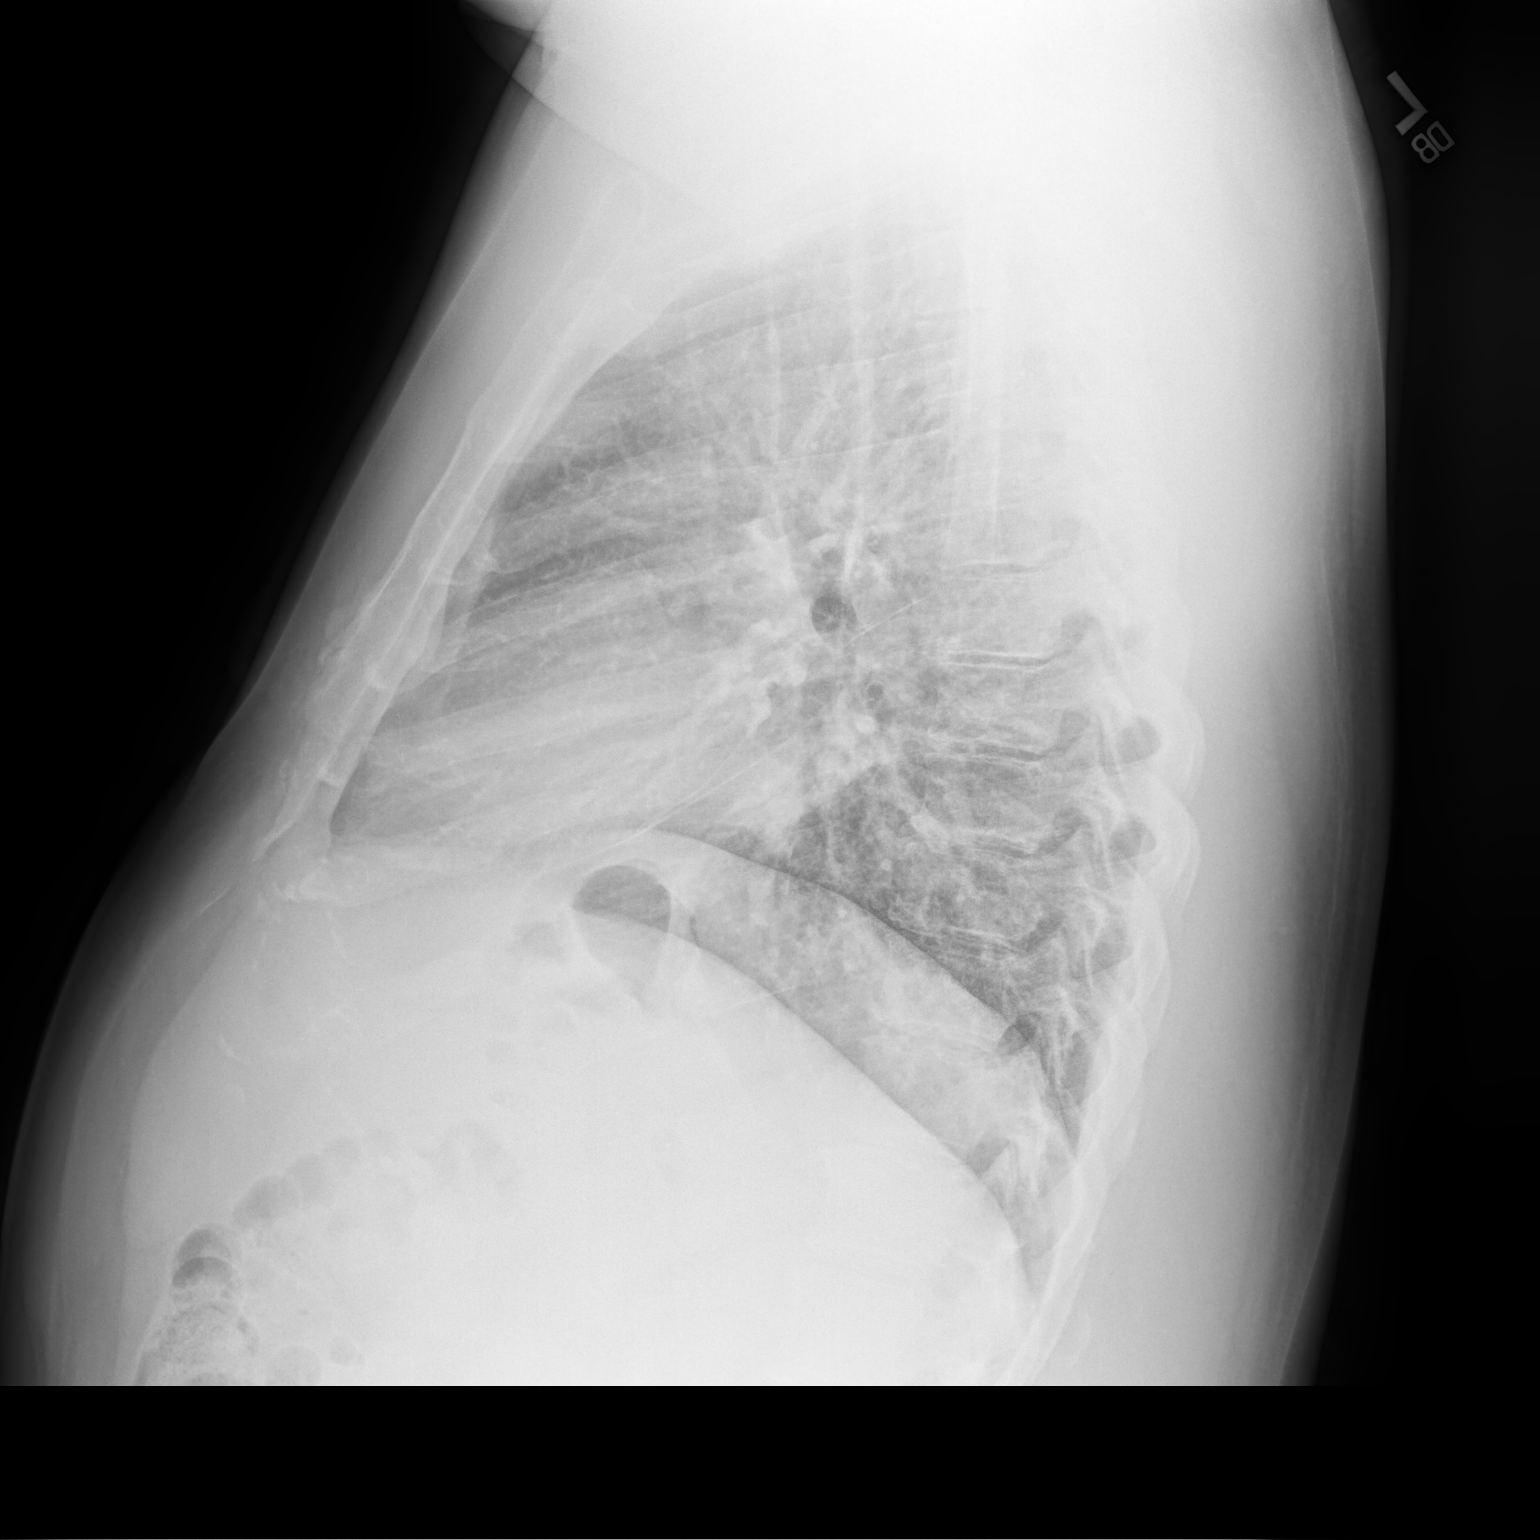

[2 of 2 positions shown; findings below may reference images not displayed]

FINDINGS: Cardiomediastinal silhouette unchanged in size and contour. No
pneumothorax or pleural effusion.

There is persisting reticulonodular opacities of the lungs,
appearing slightly worse than the comparison chest x-ray and new
from 02/13/2020.

No confluent airspace disease.

No displaced fracture.
IMPRESSION: Persisting reticulonodular opacities of the lungs, appears slightly
progressed since 05/03/2020, and new from 02/13/2020. While the
leading differential would be atypical infection, other entities
could have this appearance, and further evaluation with
high-resolution chest CT is indicated as well as consideration of
referral to pulmonology.

## 2023-03-25 ENCOUNTER — Encounter: Payer: Medicaid Other | Admitting: Family Medicine

## 2023-06-15 ENCOUNTER — Emergency Department (HOSPITAL_COMMUNITY)

## 2023-06-15 ENCOUNTER — Other Ambulatory Visit: Payer: Self-pay

## 2023-06-15 ENCOUNTER — Emergency Department (HOSPITAL_COMMUNITY)
Admission: EM | Admit: 2023-06-15 | Discharge: 2023-06-16 | Disposition: A | Discharge status: Home / Self Care | Attending: Emergency Medicine | Admitting: Emergency Medicine

## 2023-06-15 ENCOUNTER — Encounter (HOSPITAL_COMMUNITY): Payer: Self-pay

## 2023-06-15 DIAGNOSIS — R7401 Elevation of levels of liver transaminase levels: Secondary | ICD-10-CM | POA: Diagnosis not present

## 2023-06-15 DIAGNOSIS — R930 Abnormal findings on diagnostic imaging of skull and head, not elsewhere classified: Secondary | ICD-10-CM | POA: Insufficient documentation

## 2023-06-15 DIAGNOSIS — Z79899 Other long term (current) drug therapy: Secondary | ICD-10-CM | POA: Diagnosis not present

## 2023-06-15 DIAGNOSIS — M79604 Pain in right leg: Secondary | ICD-10-CM | POA: Diagnosis present

## 2023-06-15 DIAGNOSIS — M5416 Radiculopathy, lumbar region: Secondary | ICD-10-CM | POA: Insufficient documentation

## 2023-06-15 DIAGNOSIS — M5410 Radiculopathy, site unspecified: Secondary | ICD-10-CM

## 2023-06-15 LAB — CBC WITH DIFFERENTIAL/PLATELET
Abs Immature Granulocytes: 0.03 10*3/uL (ref 0.00–0.07)
Basophils Absolute: 0.1 10*3/uL (ref 0.0–0.1)
Basophils Relative: 1 %
Eosinophils Absolute: 0.1 10*3/uL (ref 0.0–0.5)
Eosinophils Relative: 1 %
HCT: 44.7 % (ref 39.0–52.0)
Hemoglobin: 15.5 g/dL (ref 13.0–17.0)
Immature Granulocytes: 1 %
Lymphocytes Relative: 26 %
Lymphs Abs: 1.7 10*3/uL (ref 0.7–4.0)
MCH: 32 pg (ref 26.0–34.0)
MCHC: 34.7 g/dL (ref 30.0–36.0)
MCV: 92.2 fL (ref 80.0–100.0)
Monocytes Absolute: 0.4 10*3/uL (ref 0.1–1.0)
Monocytes Relative: 6 %
Neutro Abs: 4.4 10*3/uL (ref 1.7–7.7)
Neutrophils Relative %: 65 %
Platelets: 355 10*3/uL (ref 150–400)
RBC: 4.85 MIL/uL (ref 4.22–5.81)
RDW: 11 % — ABNORMAL LOW (ref 11.5–15.5)
WBC: 6.6 10*3/uL (ref 4.0–10.5)
nRBC: 0 % (ref 0.0–0.2)

## 2023-06-15 LAB — CK: Total CK: 272 U/L (ref 49–397)

## 2023-06-15 LAB — COMPREHENSIVE METABOLIC PANEL WITH GFR
ALT: 67 U/L — ABNORMAL HIGH (ref 0–44)
AST: 58 U/L — ABNORMAL HIGH (ref 15–41)
Albumin: 4.4 g/dL (ref 3.5–5.0)
Alkaline Phosphatase: 67 U/L (ref 38–126)
Anion gap: 15 (ref 5–15)
BUN: 10 mg/dL (ref 6–20)
CO2: 21 mmol/L — ABNORMAL LOW (ref 22–32)
Calcium: 9.6 mg/dL (ref 8.9–10.3)
Chloride: 98 mmol/L (ref 98–111)
Creatinine, Ser: 0.93 mg/dL (ref 0.61–1.24)
GFR, Estimated: >60 mL/min (ref >60)
Glucose, Bld: 151 mg/dL — ABNORMAL HIGH (ref 70–99)
Potassium: 4.1 mmol/L (ref 3.5–5.1)
Sodium: 134 mmol/L — ABNORMAL LOW (ref 135–145)
Total Bilirubin: 1.3 mg/dL — ABNORMAL HIGH (ref 0.0–1.2)
Total Protein: 8.1 g/dL (ref 6.5–8.1)

## 2023-06-15 LAB — MAGNESIUM: Magnesium: 2 mg/dL (ref 1.7–2.4)

## 2023-06-15 MED ORDER — LORAZEPAM 1 MG PO TABS
1.0000 mg | ORAL_TABLET | Freq: Once | ORAL | Status: AC | PRN
  Administered 2023-06-16: 1 mg via ORAL
  Filled 2023-06-15: qty 1

## 2023-06-15 MED ORDER — METHOCARBAMOL 500 MG PO TABS
1000.0000 mg | ORAL_TABLET | Freq: Once | ORAL | Status: AC
  Administered 2023-06-15: 1000 mg via ORAL
  Filled 2023-06-15: qty 2

## 2023-06-15 MED ORDER — LACTATED RINGERS IV BOLUS
1000.0000 mL | Freq: Once | INTRAVENOUS | Status: AC
  Administered 2023-06-15: 1000 mL via INTRAVENOUS

## 2023-06-15 MED ORDER — MORPHINE SULFATE (PF) 4 MG/ML IV SOLN
4.0000 mg | Freq: Once | INTRAVENOUS | Status: AC
  Administered 2023-06-15: 4 mg via INTRAVENOUS
  Filled 2023-06-15: qty 1

## 2023-06-15 NOTE — ED Triage Notes (Signed)
 PER EMS: pt is from home with c/o right leg pain and muscle spasms. He reports it started on Sunday and has progressively worsened to the point to where he is now unable to walk. He was laying in bed all day, crawled to his couch and then called 911. EMS reports they found him laying on the floor unable to stand/get up. He denies injury, recent fall and he denies any back pain.  He states he has been eating and drinking okay.   BP- 168/100, HR-104, 96% RA

## 2023-06-16 ENCOUNTER — Emergency Department (HOSPITAL_COMMUNITY)

## 2023-06-16 LAB — D-DIMER, QUANTITATIVE: D-Dimer, Quant: <0.27 ug{FEU}/mL (ref 0.00–0.50)

## 2023-06-16 MED ORDER — METHYLPREDNISOLONE 4 MG PO TBPK: ORAL_TABLET | ORAL | 0 refills | Status: AC

## 2023-06-16 MED ORDER — DEXAMETHASONE SODIUM PHOSPHATE 10 MG/ML IJ SOLN
10.0000 mg | Freq: Once | INTRAMUSCULAR | Status: AC
  Administered 2023-06-16: 10 mg via INTRAVENOUS
  Filled 2023-06-16: qty 1

## 2023-06-16 MED ORDER — KETOROLAC TROMETHAMINE 15 MG/ML IJ SOLN
15.0000 mg | Freq: Once | INTRAMUSCULAR | Status: AC
  Administered 2023-06-16: 15 mg via INTRAVENOUS
  Filled 2023-06-16: qty 1

## 2023-06-16 NOTE — ED Provider Notes (Signed)
 [place] EMERGENCY DEPARTMENT AT [facility] Provider Note   CSN: [id number] Arrival date & time: 06/15/23  2105     History  Chief Complaint  Patient presents with   Muscle Pain    Collin Kaiser is a 48 y.o. male.  Patient presents to the Emergency Department complaining of right sided leg pain.  He states that symptoms began on Sunday and felt like muscle cramping.  He states throughout the week symptoms have increased.  He now has pain that radiates from the right buttock down the right leg.  Patient rates pain as 10 out of 10 in severity.  He is writhing in the stretcher in pain upon arrival.  He denies any new injury to the area.  Denies any back pain.  Denies urinary or fecal incontinence, urinary retention.  Denies neurodeficits.   Muscle Pain       Home Medications Prior to Admission medications   Medication Sig Start Date End Date Taking? Authorizing Provider  methylPREDNISolone  (MEDROL  DOSEPAK) 4 MG TBPK tablet Take as directed per package instructions 06/16/23  Yes Elisa Guest, PA-C  albuterol  (VENTOLIN  HFA) 108 (90 Base) MCG/ACT inhaler Inhale 1-2 puffs into the lungs every 6 (six) hours as needed for wheezing or shortness of breath. 12/21/19   Raymona Caldwell, MD  diazepam  (VALIUM ) 5 MG tablet Take 1 tablet (5 mg total) by mouth 2 (two) times daily as needed for anxiety. 11/10/22   Sueellen Emery, MD  EPINEPHrine  0.3 mg/0.3 mL IJ SOAJ injection Inject 0.3 mg into the muscle once as needed. Allergic reaction 05/03/20   Webb, Padonda B, FNP  losartan  (COZAAR ) 50 MG tablet Take 1 tablet (50 mg total) by mouth daily. 12/19/22   Luevenia Saha, MD  multivitamin (ONE-A-DAY MEN'S) TABS tablet Take 1 tablet by mouth daily.    [provider]  rosuvastatin  (CRESTOR ) 5 MG tablet Take 1 tablet (5 mg total) by mouth daily. 03/12/22   Luevenia Saha, MD      Allergies    Shellfish allergy, Lactose, and Tamiflu [oseltamivir phosphate]    Review of Systems    Review of Systems  Physical Exam Updated Vital Signs BP (!) 151/98 (BP Location: Left Arm)   Pulse 84   Temp 98.5 F (36.9 C) (Oral)   Resp (!) 22   Ht 5\' 8"  (1.727 m)   Wt 101.2 kg   SpO2 100%   BMI 33.91 kg/m  Physical Exam Vitals and nursing note reviewed.  Constitutional:      General: He is not in acute distress.    Appearance: He is well-developed.  HENT:     Head: Normocephalic and atraumatic.  Eyes:     Conjunctiva/sclera: Conjunctivae normal.  Cardiovascular:     Rate and Rhythm: Normal rate and regular rhythm.  Pulmonary:     Effort: Pulmonary effort is normal. No respiratory distress.     Breath sounds: Normal breath sounds.  Abdominal:     Palpations: Abdomen is soft.     Tenderness: There is no abdominal tenderness.  Musculoskeletal:        General: Tenderness present. No swelling, deformity or signs of injury. Normal range of motion.     Cervical back: Neck supple.     Comments: Patient with grossly normal range of motion of the right lower extremity.  He complains of pain with palpation of any portion of the upper and lower leg.  No swelling, warmth, redness appreciated.  Mild tenderness to palpation  of the right sided lumbar region.  Skin:    General: Skin is warm and dry.     Capillary Refill: Capillary refill takes less than 2 seconds.  Neurological:     Mental Status: He is alert.  Psychiatric:        Mood and Affect: Mood normal.     ED Results / Procedures / Treatments   Labs (all labs ordered are listed, but only abnormal results are displayed) Labs Reviewed  COMPREHENSIVE METABOLIC PANEL WITH GFR - Abnormal; Notable for the following components:      Result Value   Sodium 134 (*)    CO2 21 (*)    Glucose, Bld 151 (*)    AST 58 (*)    ALT 67 (*)    Total Bilirubin 1.3 (*)    All other components within normal limits  CBC WITH DIFFERENTIAL/PLATELET - Abnormal; Notable for the following components:   RDW 11.0 (*)    All other  components within normal limits  CK  MAGNESIUM  D-DIMER, QUANTITATIVE    EKG None  Radiology MR LUMBAR SPINE WO CONTRAST Result Date: 06/16/2023 CLINICAL DATA:  Initial evaluation for acute right leg pain and weakness. EXAM: MRI LUMBAR SPINE WITHOUT CONTRAST TECHNIQUE: Multiplanar, multisequence MR imaging of the lumbar spine was performed. No intravenous contrast was administered. COMPARISON:  Radiograph from 05/24/2021 FINDINGS: Segmentation: Standard. Lowest well-formed disc space labeled the L5-S1 level. Alignment: Physiologic with preservation of the normal lumbar lordosis. No listhesis. Vertebrae: Vertebral body height maintained without acute or chronic fracture. Decreased T1 signal intensity noted within the visualized bone marrow, nonspecific, but most commonly related to anemia, smoking or obesity. No worrisome osseous lesions or abnormal marrow edema. Conus medullaris and cauda equina: Conus extends to the L1-2 level. Conus and cauda equina appear normal. Paraspinal and other soft tissues: Unremarkable. Disc levels: L1-2:  Unremarkable. L2-3:  Unremarkable. L3-4: Degenerative intervertebral disc space narrowing with diffuse disc bulge and disc desiccation. Reactive endplate spurring. Superimposed biforaminal disc protrusions, right larger than left, contacting both of the exiting L3 nerve roots, either which could be affected. Mild epidural lipomatosis. Resultant mild spinal stenosis. Moderate right worse than left L3 foraminal narrowing. L4-5: Negative interspace. Mild facet hypertrophy. Epidural lipomatosis. No significant canal or foraminal stenosis. L5-S1: Degenerative vertebral disc space narrowing with disc desiccation and diffuse disc bulge. Superimposed small central disc extrusion with annular fissure. Associated slight inferior migration. Epidural lipomatosis. No significant spinal stenosis. Foramina remain patent. IMPRESSION: 1. Degenerative spondylosis with biforaminal disc  protrusions at L3-4, right larger than left. Disc material contacts both of the exiting L3 nerve roots, either which could be affected. Query L3 radiculitis. 2. Small central disc extrusion at L5-S1 without significant stenosis or overt neural impingement. Electronically Signed   By: Virgia Griffins M.D.   On: 06/16/2023 02:57   MR THORACIC SPINE WO CONTRAST Result Date: 06/16/2023 CLINICAL DATA:  Initial evaluation for acute right leg pain, weakness. EXAM: MRI THORACIC SPINE WITHOUT CONTRAST TECHNIQUE: Multiplanar, multisequence MR imaging of the thoracic spine was performed. No intravenous contrast was administered. COMPARISON:  None Available. FINDINGS: Alignment: Mild dextroscoliosis. Alignment otherwise normal preservation of the normal thoracic kyphosis. No listhesis. Vertebrae: Vertebral body height maintained without acute or chronic fracture. Decreased T1 signal intensity within the visualized bone marrow, nonspecific, but most commonly related to anemia, smoking or obesity. No worrisome osseous lesions. Mild degenerate reactive endplate changes with marrow edema present about the T9-10 and T10-11 interspaces. No other abnormal marrow  edema. Cord:  Normal signal morphology. Paraspinal and other soft tissues: Paraspinous soft tissues within normal limits. Trace layering bilateral pleural effusions noted. Disc levels: T1-2:  Unremarkable. T2-3: Unremarkable. T3-4: Mild biforaminal disc bulge. No spinal stenosis. Mild right foraminal stenosis. Left neural foramina remains patent. T4-5: Mild biforaminal disc bulging. No spinal stenosis. Foramina remain patent. T5-6: Negative interspace. Mild right-sided facet degeneration. No canal or foraminal stenosis. T6-7: Left eccentric disc bulge. No spinal stenosis. Foramina remain patent. T7-8:  Unremarkable. T8-9: Disc desiccation with mild disc bulge. Superimposed shallow central to left paracentral disc protrusion mildly flattens the ventral thecal sac. No  significant spinal stenosis. Foramina remain patent. T9-10: Small central disc protrusion indents the ventral thecal sac (series 22, image 26). Minimal cord flattening without cord signal changes. No significant spinal stenosis. Foramina remain patent. T10-11: Mild disc bulge with endplate spurring. Mild facet hypertrophy. No significant spinal stenosis. Mild bilateral foraminal narrowing. T11-12: Negative interspace. Mild facet hypertrophy. No significant stenosis. T12-L1: Endplate spurring without significant disc bulge. No spinal stenosis. Foramina remain patent. IMPRESSION: 1. No acute abnormality within the thoracic spine. 2. Small central disc protrusion at T9-10 with minimal cord flattening, but no cord signal changes or significant stenosis. 3. Additional mild noncompressive disc bulging elsewhere within the thoracic spine as above. No significant spinal stenosis. Mild foraminal narrowing on the right at T3-4 and bilaterally at T10-11. 4. Trace layering bilateral pleural effusions. Electronically Signed   By: Virgia Griffins M.D.   On: 06/16/2023 02:50   DG Pelvis 1-2 Views Result Date: 06/15/2023 CLINICAL DATA:  Right leg pain and muscle spasms. EXAM: PELVIS - 1-2 VIEW COMPARISON:  None Available. FINDINGS: There is no evidence of pelvic fracture or diastasis. No pelvic bone lesions are seen. IMPRESSION: Negative. Electronically Signed   By: Boyce Byes M.D.   On: 06/15/2023 23:05    Procedures Procedures    Medications Ordered in ED Medications  methocarbamol (ROBAXIN) tablet 1,000 mg (1,000 mg Oral Given 06/15/23 2218)  lactated ringers bolus 1,000 mL (0 mLs Intravenous Stopped 06/16/23 0129)  morphine (PF) 4 MG/ML injection 4 mg (4 mg Intravenous Given 06/15/23 2334)  LORazepam (ATIVAN) tablet 1 mg (1 mg Oral Given 06/16/23 0046)  dexamethasone  (DECADRON ) injection 10 mg (10 mg Intravenous Given 06/16/23 0338)  ketorolac  (TORADOL ) 15 MG/ML injection 15 mg (15 mg Intravenous Given  06/16/23 3244)    ED Course/ Medical Decision Making/ A&P                                 Medical Decision Making Amount and/or Complexity of Data Reviewed Labs: ordered. Radiology: ordered.  Risk Prescription drug management.   This patient presents to the ED for concern of right lower extremity pain, this involves an extensive number of treatment options, and is a complaint that carries with it a high risk of complications and morbidity.  The differential diagnosis includes radiculopathy, fracture, dislocation, neurovascular process, arterial occlusion, DVT, others   Co morbidities / Chronic conditions that complicate the patient evaluation  History of substance abuse, varicose veins, GAD   Additional history obtained:  Additional history obtained from EMR   Lab Tests:  I Ordered, and personally interpreted labs.  The pertinent results include: Minimally elevated AST at 58, ALT 67; CK2 72; D-dimer less than 0.27, magnesium 2.0   Imaging Studies ordered:  I ordered imaging studies including pelvic x-ray, MRI of the thoracic and lumbar spine  I independently visualized and interpreted imaging which showed no acute finding on plain films. 1. No acute abnormality within the thoracic spine.  2. Small central disc protrusion at T9-10 with minimal cord  flattening, but no cord signal changes or significant stenosis.  3. Additional mild noncompressive disc bulging elsewhere within the  thoracic spine as above. No significant spinal stenosis. Mild  foraminal narrowing on the right at T3-4 and bilaterally at T10-11.  4. Trace layering bilateral pleural effusions   1. Degenerative spondylosis with biforaminal disc protrusions at  L3-4, right larger than left. Disc material contacts both of the  exiting L3 nerve roots, either which could be affected. Query L3  radiculitis.  2. Small central disc extrusion at L5-S1 without significant  stenosis or overt neural impingement.   I  agree with the radiologist interpretation   Problem List / ED Course / Critical interventions / Medication management   I ordered medication including Robaxin, morphine, Ativan, LR, Toradol , Decadron  Reevaluation of the patient after these medicines showed that the patient improved I have reviewed the patients home medicines and have made adjustments as needed   Social Determinants of Health:  Patient has Medicaid for his primary health insurance time   Test / Admission - Considered:  Negative D-dimer showing no clot burden.  Negative plain films showing no fracture, dislocation, or avascular necrosis of the right hip.  Lab work does not show signs of rhabdomyolysis.  MRI showing signs of possible L3 radiculitis which likely explains patient's symptoms.  Patient will need to follow-up with neurosurgery for further evaluation.  He was treated this evening with multimodal pain control along with steroids.  He was able to ambulate and requested crutches which were provided.  Will discharge on course of steroids and recommend neurosurgery follow-up.         Final Clinical Impression(s) / ED Diagnoses Final diagnoses:  Radiculitis    Rx / DC Orders ED Discharge Orders          Ordered    methylPREDNISolone  (MEDROL  DOSEPAK) 4 MG TBPK tablet        06/16/23 0450              Elisa Guest, PA-C 06/16/23 0458    Eldon Greenland, MD 06/16/23 [phone]

## 2023-06-16 NOTE — Discharge Instructions (Addendum)
 You were seen this evening for right sided leg pain.  You need to schedule a follow-up appointment with neurosurgery for further evaluation.  I have attached contact information.  I sent a steroid Dosepak to the pharmacy to help with your inflammation over the next few days.  Return to the emergency department if you develop any life-threatening symptoms.

## 2023-06-16 NOTE — Progress Notes (Signed)
 Orthopedic Tech Progress Note Patient Details:  Collin Kaiser Aug 19, 1975 [id number]  Ortho Devices Type of Ortho Device: Crutches Ortho Device/Splint Interventions: Ordered, Application, Adjustment   Post Interventions Patient Tolerated: Well Instructions Provided: Care of device, Adjustment of device  [name] 06/16/2023, 4:55 AM

## 2023-06-16 NOTE — ED Notes (Signed)
 Pt to MRI via stretcher.

## 2023-07-23 ENCOUNTER — Ambulatory Visit: Payer: Self-pay

## 2023-07-23 NOTE — Telephone Encounter (Signed)
 FYI Only or Action Required?: Action required by provider: request for appointment.  Patient was last seen in primary care on 12/19/2022 by Jodie Lavern CROME, MD.  Called Nurse Triage reporting Hip Pain.  Symptoms began several months ago.  Interventions attempted: Prescription medications: muscle relaxer and predisone.  Symptoms are: unchanged.  Triage Disposition: See PCP When Office is Open (Within 3 Days)  Patient/caregiver understands and will follow disposition?: Yes

## 2023-07-23 NOTE — Telephone Encounter (Signed)
 Reason for Disposition . [1] MODERATE pain (e.g., interferes with normal activities, limping) AND [2] present > 3 days  Answer Assessment - Initial Assessment Questions 1. LOCATION and RADIATION: Where is the pain located?      Right hip- 2. QUALITY: What does the pain feel like?  (e.g., sharp, dull, aching, burning)     Aching  3. SEVERITY: How bad is the pain? What does it keep you from doing?   (Scale 1-10; or mild, moderate, severe)   -  MILD (1-3): doesn't interfere with normal activities    -  MODERATE (4-7): interferes with normal activities (e.g., work or school) or awakens from sleep, limping    -  SEVERE (8-10): excruciating pain, unable to do any normal activities, unable to walk     moderate 4. ONSET: When did the pain start? Does it come and go, or is it there all the time?     6+ weeks 5. WORK OR EXERCISE: Has there been any recent work or exercise that involved this part of the body?      Na  6. CAUSE: What do you think is causing the hip pain?      Not sure 7. AGGRAVATING FACTORS: What makes the hip pain worse? (e.g., walking, climbing stairs, running)     Movement  8. OTHER SYMPTOMS: Do you have any other symptoms? (e.g., back pain, pain shooting down leg,  fever, rash)     Nauseated.     Pt is not sure what started this but Can't stand/walk too long.   Pt saw neurologist and was diagnosed with disorder but can't remember name of it. Pt has been resting and doing PT to help but not improving. Pt has appt 7/9 @ 0800  Pt was given muscle relaxer and predisone.  Protocols used: Hip Pain-A-AH

## 2023-07-24 ENCOUNTER — Encounter: Payer: Self-pay | Admitting: Family

## 2023-07-24 ENCOUNTER — Ambulatory Visit (INDEPENDENT_AMBULATORY_CARE_PROVIDER_SITE_OTHER): Admitting: Family

## 2023-07-24 ENCOUNTER — Ambulatory Visit: Admitting: Internal Medicine

## 2023-07-24 VITALS — BP 118/80 | HR 94 | Temp 98.1°F | Ht 68.0 in | Wt 217.4 lb

## 2023-07-24 DIAGNOSIS — M5416 Radiculopathy, lumbar region: Secondary | ICD-10-CM | POA: Diagnosis not present

## 2023-07-24 MED ORDER — MELOXICAM 15 MG PO TABS: 15.0000 mg | ORAL_TABLET | Freq: Every day | ORAL | 2 refills | Status: AC

## 2023-07-24 MED ORDER — METHOCARBAMOL 750 MG PO TABS: 750.0000 mg | ORAL_TABLET | Freq: Four times a day (QID) | ORAL | 1 refills | Status: AC

## 2023-07-24 NOTE — Telephone Encounter (Signed)
 Appt today
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
